# Patient Record
Sex: Female | Born: 1970 | Race: Black or African American | Hispanic: No | Marital: Single | Smoking: Never smoker
Health system: Southern US, Community
[De-identification: ages and names within clinical notes are randomized; demographics above are authoritative.]

## PROBLEM LIST (undated history)

## (undated) DIAGNOSIS — R42 Dizziness and giddiness: Secondary | ICD-10-CM

## (undated) HISTORY — PX: ABDOMINAL HYSTERECTOMY: SHX81

## (undated) HISTORY — PX: TOTAL KNEE ARTHROPLASTY: SHX125

---

## 2001-10-27 ENCOUNTER — Emergency Department (HOSPITAL_COMMUNITY): Admission: EM | Admit: 2001-10-27 | Discharge: 2001-10-27 | Payer: Self-pay | Admitting: Emergency Medicine

## 2001-10-27 ENCOUNTER — Encounter: Payer: Self-pay | Admitting: *Deleted

## 2001-12-30 ENCOUNTER — Encounter: Payer: Self-pay | Admitting: Emergency Medicine

## 2001-12-30 ENCOUNTER — Emergency Department (HOSPITAL_COMMUNITY): Admission: EM | Admit: 2001-12-30 | Discharge: 2001-12-30 | Payer: Self-pay | Admitting: Emergency Medicine

## 2002-01-02 ENCOUNTER — Emergency Department (HOSPITAL_COMMUNITY): Admission: EM | Admit: 2002-01-02 | Discharge: 2002-01-02 | Payer: Self-pay | Admitting: *Deleted

## 2002-05-10 ENCOUNTER — Emergency Department (HOSPITAL_COMMUNITY): Admission: EM | Admit: 2002-05-10 | Discharge: 2002-05-10 | Payer: Self-pay | Admitting: Internal Medicine

## 2002-07-17 ENCOUNTER — Encounter: Payer: Self-pay | Admitting: Emergency Medicine

## 2002-07-17 ENCOUNTER — Emergency Department (HOSPITAL_COMMUNITY): Admission: EM | Admit: 2002-07-17 | Discharge: 2002-07-17 | Payer: Self-pay | Admitting: Emergency Medicine

## 2002-08-04 ENCOUNTER — Inpatient Hospital Stay (HOSPITAL_COMMUNITY): Admission: RE | Admit: 2002-08-04 | Discharge: 2002-08-06 | Payer: Self-pay | Admitting: Obstetrics and Gynecology

## 2002-08-10 ENCOUNTER — Emergency Department (HOSPITAL_COMMUNITY): Admission: EM | Admit: 2002-08-10 | Discharge: 2002-08-10 | Payer: Self-pay | Admitting: Emergency Medicine

## 2003-09-29 ENCOUNTER — Emergency Department (HOSPITAL_COMMUNITY): Admission: EM | Admit: 2003-09-29 | Discharge: 2003-09-29 | Payer: Self-pay | Admitting: Emergency Medicine

## 2007-06-06 ENCOUNTER — Emergency Department (HOSPITAL_COMMUNITY): Admission: EM | Admit: 2007-06-06 | Discharge: 2007-06-06 | Payer: Self-pay | Admitting: Emergency Medicine

## 2007-08-04 ENCOUNTER — Emergency Department (HOSPITAL_COMMUNITY): Admission: EM | Admit: 2007-08-04 | Discharge: 2007-08-04 | Payer: Self-pay | Admitting: Emergency Medicine

## 2007-08-27 ENCOUNTER — Emergency Department (HOSPITAL_COMMUNITY): Admission: EM | Admit: 2007-08-27 | Discharge: 2007-08-27 | Payer: Self-pay | Admitting: Emergency Medicine

## 2008-11-19 ENCOUNTER — Emergency Department (HOSPITAL_COMMUNITY): Admission: EM | Admit: 2008-11-19 | Discharge: 2008-11-19 | Payer: Self-pay | Admitting: Emergency Medicine

## 2009-04-27 ENCOUNTER — Emergency Department (HOSPITAL_COMMUNITY): Admission: EM | Admit: 2009-04-27 | Discharge: 2009-04-27 | Payer: Self-pay | Admitting: Emergency Medicine

## 2009-04-27 ENCOUNTER — Encounter: Payer: Self-pay | Admitting: Orthopedic Surgery

## 2009-06-11 ENCOUNTER — Emergency Department (HOSPITAL_COMMUNITY): Admission: EM | Admit: 2009-06-11 | Discharge: 2009-06-11 | Payer: Self-pay | Admitting: Emergency Medicine

## 2009-09-05 ENCOUNTER — Encounter: Payer: Self-pay | Admitting: Orthopedic Surgery

## 2009-09-05 ENCOUNTER — Emergency Department (HOSPITAL_COMMUNITY): Admission: EM | Admit: 2009-09-05 | Discharge: 2009-09-05 | Payer: Self-pay | Admitting: Emergency Medicine

## 2009-09-16 ENCOUNTER — Ambulatory Visit: Payer: Self-pay | Admitting: Orthopedic Surgery

## 2009-09-16 DIAGNOSIS — IMO0002 Reserved for concepts with insufficient information to code with codable children: Secondary | ICD-10-CM

## 2009-11-04 ENCOUNTER — Encounter: Payer: Self-pay | Admitting: Orthopedic Surgery

## 2010-04-22 NOTE — Assessment & Plan Note (Signed)
Summary: [facility]  6/16 left knee sprain xr there/selfpay/bsf   Vital Signs:  Patient profile:   40 year old female Height:      64 inches Weight:      244 pounds Pulse rate:   64 / minute Resp:     16 per minute  Vitals Entered By: Fuller Canada MD (September 16, 2009 9:20 AM)  Visit Type:  new patient Referring Provider:  [facility] Primary Provider:  Dr. Sheliah Mends  CC:  left knee pain.  History of Present Illness: I saw [name] in the office today for an initial visit.  She is a 40 years old woman with the complaint of:  left knee pain.  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  This was done at [facility].  She says there was talk about amputating her leg.  She has had no major difficulty with the LEFT knee prosthesis since that time until she fell about 11 days ago on June 16.  She complains of medial knee pain.  Xrays [facility] 09/05/09 left knee.  other imaging studies include the following:  L spine xrays February 2011   Meds: Hydrocodone 7.5 for pain as needed, from er helps, needs refill.  She is currently wearing a straight leg knee brace which seems to control the stability of her leg and improve the pain along with a medicine  Allergies (verified): No Known Drug Allergies  Past History:  Past Medical History: na  Past Surgical History: left salk left total knee hysterectomy  Family History: FH of Cancer:  Family History of Diabetes Family History Coronary Heart Disease female < 62 Family History of Arthritis Hx, family, kidney disease NEC  Social History: Patient is single.  unemployed no smoking occasional alcohol no caffeine 12th grade education  Review of Systems Musculoskeletal:  Complains of joint pain, swelling, instability, and stiffness; denies redness, heat,  and muscle pain.  The review of systems is negative for Constitutional, Cardiovascular, Respiratory, Gastrointestinal, Genitourinary, Neurologic, Endocrine, Psychiatric, Skin, HEENT, Immunology, and Hemoatologic.  Physical Exam  Skin:  large curvilinear incision over the LEFT knee which is healed it goes up into the thigh area as well Psych:  alert and cooperative; normal mood and affect; normal attention span and concentration   Knee Exam  General:    slightly obese female vital signs as recorded  Gait:    ambulates with a slight limp favoring the LEFT side with a brace  Inspection:    no swelling in the LEFT knee  Palpation:    tenderness on the medial knee along the medial hamstrings and the medial joint line  Vascular:    There was no swelling or varicose veins. The pulses and temperature are normal. There was no edema or tenderness.  Sensory:    Gross coordination and sensation were normal.    Motor:    motor strength remained normal in extension as well as flexion  Knee Exam:    Left:    Inspection/Palpation:  LEFT knee has mild valgus laxity in extension as well as flexion.  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 1 by mouth q 4 as needed pain  Patient Instructions: 1)  Wear the  brace for  4 weeks  2)  Use a cane in your right hand  3)  Take the brace off for exercises for knee  4)  bending 25 times 5)  f/u appt in 4 weeks  Prescriptions: NORCO 7.5-325 MG TABS (HYDROCODONE-ACETAMINOPHEN) 1 by mouth q 4 as needed pain  #84 x 1   Entered and Authorized by:   Fuller Canada MD   Signed by:   Fuller Canada MD on 09/16/2009   Method used:   Print then Give to Patient   RxID:   [id number]

## 2010-04-22 NOTE — Letter (Signed)
Summary: missed appointment  missed appointment   Imported By: Cammie Sickle 11/14/2009 08:37:22  _____________________________________________________________________  External Attachment:    Type:   Image     Comment:   External Document

## 2010-04-22 NOTE — Medication Information (Signed)
Summary: RX Folder knee brace  RX Folder knee brace   Imported By: Cammie Sickle 09/17/2009 11:49:58  _____________________________________________________________________  External Attachment:    Type:   Image     Comment:   External Document

## 2010-04-25 NOTE — Letter (Signed)
Summary: History form  History form   Imported By: Jacklynn Ganong 09/17/2009 16:34:50  _____________________________________________________________________  External Attachment:    Type:   Image     Comment:   External Document

## 2010-06-28 LAB — BASIC METABOLIC PANEL
BUN: 14 mg/dL (ref 6–23)
Calcium: 9.3 mg/dL (ref 8.4–10.5)
Chloride: 107 mEq/L (ref 96–112)
Creatinine, Ser: 0.88 mg/dL (ref 0.4–1.2)
GFR calc non Af Amer: >60 mL/min (ref >60)
Glucose, Bld: 100 mg/dL — ABNORMAL HIGH (ref 70–99)

## 2010-06-28 LAB — DIFFERENTIAL
Basophils Absolute: 0 10*3/uL (ref 0.0–0.1)
Basophils Relative: 0 % (ref 0–1)
Eosinophils Relative: 1 % (ref 0–5)
Lymphocytes Relative: 26 % (ref 12–46)
Lymphs Abs: 1.9 10*3/uL (ref 0.7–4.0)
Neutro Abs: 5.1 10*3/uL (ref 1.7–7.7)
Neutrophils Relative %: 69 % (ref 43–77)

## 2010-06-28 LAB — CBC
HCT: 39.5 % (ref 36.0–46.0)
MCHC: 35.6 g/dL (ref 30.0–36.0)
RBC: 4.48 MIL/uL (ref 3.87–5.11)
WBC: 7.4 10*3/uL (ref 4.0–10.5)

## 2010-08-08 NOTE — H&P (Signed)
NAMEAUJANAE, MCCULLUM                           ACCOUNT NO.:  [id number]   MEDICAL RECORD NO.:  [id number]                   PATIENT TYPE:  AMB   LOCATION:  DAY                                  FACILITY:  [facility]   PHYSICIAN:  Tilda Burrow, M.D.              DATE OF BIRTH:  1970/12/27   DATE OF ADMISSION:  08/04/2002  DATE OF DISCHARGE:                                HISTORY & PHYSICAL   ADMISSION DIAGNOSES:  1. Dermoid cyst of right ovary.  2. Menorrhagia.   HISTORY OF PRESENT ILLNESS:  This 40 year old female, gravida 2, para 2,  status post tubal ligation, comes in with a prior history of left  oophorectomy for a cyst many years ago.  She is admitted at this time for  removal of a right ovarian dermoid and possible removal of the entire right  tube and ovary and the uterus due to a symptomatic right ovarian dermoid.  The patient has been followed through our office intermittently over the  past few years.  In 2001, the dermoid was noted on ultrasound.  The dermoid  was originally noted as far back as 1997.  Unfortunately over the past few  months, the size has increased to the point that she is progressively  symptomatic.  She is not relieved with Darvocet on a regular daily basis.  Exam showed tenderness in the area of the ovary.  It is felt that the mass  effect is causing discomfort.  We do not think that there is torsion at this  time.  The patient is aware that with the dermoid cyst measuring 6 x 3.6 x  3.1 that it may or may not be possible to salvage the ovary.  If salpingo-  oophorectomy is necessary, plans are to proceed with hysterectomy at the  same time.  The patient has a history of heavy periods, which in 2001  required the initiation of Depo-Provera therapy.  Depo-Provera has been  effective, but according to the patient it is preferable to proceed with  hysterectomy if the ovary cannot be preserved.  This management plan was  discussed at length with her at her  last office visit on Jul 24, 2002, with  the patient expressing understanding and full support.  The patient is  clearly aware that if a hysterectomy is performed that she will be  permanently incapable of bearing children as required by Medicaid.   PAST MEDICAL HISTORY:  Benign.   PAST SURGICAL HISTORY:  1. In 1987, cesarean section.  2. In 1994, left salpingo-oophorectomy  3. 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   PHYSICAL EXAMINATION:  GENERAL APPEARANCE:  A healthy, large-framed, African  American female.  WEIGHT:  230-1/2 pounds.  VITAL SIGNS:  Blood pressure 124/66.  HEENT:  Pupils equal, round, and reactive.  Extraocular movements intact.  NECK:  Supple.  Trachea midline.  CHEST:  Clear to auscultation.  ABDOMEN:  Well-healed abdominal scar, midline.  PELVIC:  External genitalia normal.  Vaginal exam shows normal secretions.  Reports normal Pap smear with no history of prior abnormalities.  Cervix  smooth and normal to appearance.  Uterus anterior and normal in size, shape,  and contour.  Adnexa with previously mentioned cyst on the right side and  diffuse tenderness on diffuse exam due to the patient's size.  EXTREMITIES:  Evidence of left knee replacement with no cyanosis, clubbing,  or edema.   PLAN:  Laparotomy with removal of dermoid cyst if possible or if it is  technically challenged, then may proceed directly to salpingo-oophorectomy  with hysterectomy at the same time.                                               Tilda Burrow, M.D.    JVF/MEDQ  D:  08/03/2002  T:  08/03/2002  Job:  [id number]

## 2010-08-08 NOTE — Consult Note (Signed)
   NAME:  [name], GRAJALES NO.:  [id number]   MEDICAL RECORD NO.:  [id number]                   PATIENT TYPE:  EMS   LOCATION:  ED                                   FACILITY:  [facility]   PHYSICIAN:  Tilda Burrow, M.D.              DATE OF BIRTH:  Jan 15, 1971   DATE OF CONSULTATION:  DATE OF DISCHARGE:  08/10/2002                                   CONSULTATION   HISTORY:  Allyiah presents to the emergency room at approximately 5 p.m. on  Aug 10, 2002, complaining of wound opening.  She was seen earlier today in  our office as per the patient reported having her staples taken out ahead of  schedule.  She is status post recent abdominal surgery with midline  incision.  The patient left the office without any serious new complaints,  with the incision appearing intact.  When she went home and used the  bathroom, leaning forward caused a disruption of the circumcision of the  skin and she presented to the emergency room for evaluation.  The patient  had placed a clean, dry, pad across the wound. Upon inspection it was  approximately 2.5 to 3.0 cm in depth by 10 cm in length in the midline with  clear, nonpurulent tissue seen.   PROCEDURE:  The wound was cleaned with saline solution, and sterile  technique, and then pulled together with Benzoin and deep tissue was  reapproximated quite nicely.   Due to the excellent abdominal appearance, she was sent home rather than  admitted.  She had prophylactic antibiotics with Keflex 500 t.i.d. x5 days.  She will need to follow up in our office first thing next Monday.                                               Tilda Burrow, M.D.    JVF/MEDQ  D:  08/28/2002  T:  08/28/2002  Job:  [id number]

## 2010-08-08 NOTE — Op Note (Signed)
NAME:  [name], [name] NO.:  [id number]   MEDICAL RECORD NO.:  [id number]                   PATIENT TYPE:  INP   LOCATION:  A416                                 FACILITY:  [facility]   PHYSICIAN:  Tilda Burrow, M.D.              DATE OF BIRTH:  [birth year]   DATE OF PROCEDURE:  08/04/2002  DATE OF DISCHARGE:                                 OPERATIVE REPORT   PREOPERATIVE DIAGNOSES:  Dermoid cyst right ovary, menorrhagia.   POSTOPERATIVE DIAGNOSES:  Dermoid cyst right ovary, menorrhagia.  Pelvic  adhesions.   PROCEDURE:  Total abdominal hysterectomy, right salpingo-oophorectomy.   SURGEON:  Tilda Burrow, M.D.   ASSISTANT:  Mcfadder, Tullock.   ANESTHESIA:  General.   COMPLICATIONS:  None.   FINDINGS:  Omental adhesions to the anterior abdominal wall, omental  adhesions to the right ovary, adhesions from sigmoid to back of uterus.   DESCRIPTION OF PROCEDURE:  The patient was taken to the operating room,  prepped and draped in the usual fashion for lower abdominal surgery. The  prior vertical incision was repeated with careful entry due to extensive  adhesions of omentum to the anterior abdominal wall which required 10-15  minutes a day section. We then were able to pack the bowel away and inspect  the pelvis. There were omental adhesions to the top of the ovary on the  right and there had been some torsion of the right tube and ovary though it  was not necrotic or ischemic. The omental adhesions were cut free and a raw  surface noted on the tip of the ovary. The ovary was not disrupted. The  pelvis was inspected and due to the extensive nature of the adhesions, it  was felt that efforts at preserving the uterus and portions of the ovary  would be counter productive. We then proceed with a hysterectomy and removal  of the ovary.   The round ligaments were taken down bilaterally, with clamping and cutting  and suture ligating. The right  infundibulopelvic ligament was isolated,  clamped, cut and suture ligated. The uterine vessels were skeletonized on  either side. The left remnants of the IP ligament were clamped, cut and  suture ligated close to the uterus. The bladder flap was developed  anteriorly with some difficulty due to scarring from prior cesarean  sections.   The uterine vessels were clamped, cut and suture ligated, the upper and  lower cardinal ligaments serially taken down with straight Heaney clamps,  knife dissection and #0 chromic suture ligature. Upon reaching the level of  the vaginal cuff, we entered through an anterior stab incision and  circumcised the cervix off the vaginal cuff. Two Aldridge stitches were  placed through each lateral vaginal angle for hemostasis. The irregular edge  on the anterior portion of the cervical cuff was trimmed for reapproximation  and an interrupted suture placed anteriorly to  pull the edge together. The  cuff then was closed transversely with good hemostasis appreciated though  there had been generous blood loss from the cuff edge prior to that. The  pelvis was irrigated and inspected and confirmed as hemostatic. Laparotomy  equipment removed. Omentum was inspected and three spots required hemostatic  clamps across oozing areas and then 3-0 Dexon ligature of the pedicles  around the omental adhesions. We then closed the peritoneal cavity by 2-0  Chromic closure. We then closed the fascia with running #0 Prolene, then the  subcu tissues were reapproximated using 2-0 plain and then staple closure of  the skin completed this procedure. The patient went to the recovery room in  good condition and had a small amount of vaginal blood noted in recovery  which will be observed and it was felt this represented accumulation prior  to cuff closure rather than ongoing bleeding, but will be monitored.                                               Tilda Burrow, M.D.     JVF/MEDQ  D:  08/04/2002  T:  08/05/2002  Job:  [id number]

## 2010-08-08 NOTE — Discharge Summary (Signed)
   [name], [name]                           ACCOUNT NO.:  [id number]   MEDICAL RECORD NO.:  [id number]                   PATIENT TYPE:  INP   LOCATION:  A416                                 FACILITY:  [facility]   PHYSICIAN:  Tilda Burrow, M.D.              DATE OF BIRTH:  1971-01-22   DATE OF ADMISSION:  08/04/2002  DATE OF DISCHARGE:  08/06/2002                                 DISCHARGE SUMMARY   ADMISSION DIAGNOSES:  1. Dermoid cyst, right ovary.  2. Menometrorrhagia.   DISCHARGE DIAGNOSES:  Right ovarian dermoid cyst and adhesions.   PROCEDURE:  On 08/04/02, total abdominal hysterectomy and right salpingo-  oophorectomy.   DISCHARGE MEDICATIONS:  1. Estraderm 0.1 mg patch to skin every week.  2. Tylox one to two q.4h. p.r.n. pain.   FOLLOW UP:  Five days for staple removal at our office.   HISTORY OF PRESENT ILLNESS:  This 40 year old female, gravida 2, para 2  status post tubal ligation with a history of left oophorectomy for a cyst  many years ago is admitted for removal of right ovarian dermoid, possible  removal of right tube and ovary and uterus.   HOSPITAL COURSE:  The patient was taken to the operating room on 08/04/02,  underwent a hysterectomy and right salpingo-oophorectomy which was notable  for significant amount of adhesions at the time of surgery. Estimated blood  loss was 300 cc. Postoperative care was straightforward with 300 cc of blood  loss estimated at the time of surgery. Pathology report showed nabothian  cysts, chronic cervicitis of the cervix. The right ovarian dermoid cyst  showed benign tissues of cartilage respiratory including sebaceous and hair  structures as well as thyroid tissue. There was no evidence of neural tissue  and specifically no immature neural tissue was noted.   The patient's postoperative hemoglobin was 13.9; followup was 12.6 and 34.9  compared to 13.9 and 39.0 preoperatively. The patient had an uneventful  postoperative  course and was continued stable for discharge in two days for  followup, one week after the initial surgery for staple removal.                                               Tilda Burrow, M.D.    JVF/MEDQ  D:  09/03/2002  T:  09/03/2002  Job:  [id number]

## 2010-08-08 NOTE — Consult Note (Signed)
   NAME:  [name], [name] NO.:  [id number]   MEDICAL RECORD NO.:  [id number]                   PATIENT TYPE:  EMS   LOCATION:  ED                                   FACILITY:  [facility]   PHYSICIAN:  J. Darreld Mclean, M.D.              DATE OF BIRTH:  12-16-1970   DATE OF CONSULTATION:  DATE OF DISCHARGE:  01/02/2002                                   CONSULTATION   HISTORY:  She sustained a fracture, small avulsion type, of the distal beak  of the calcaneous from a fall sustained the other week.  She was on an air  cast.  She has crutches.  This is a very small avulsion-type fracture.  No  other injuries.  Neurovascularly intact.   IMPRESSION:  Small avulsion-type fracture distal beak of the calcaneous.   PLAN:  This should heal on its own.  She already has something for pain.  She should just keep her weight off it with crutches.  I will see her in the  office in three weeks.  Any difficulty before office visit, let me know.  We  need to get x-rays at that time.                                                           Teola Bradley, M.D.    JWK/MEDQ  D:  01/02/2002  T:  01/03/2002  Job:  [id number]

## 2012-05-27 ENCOUNTER — Emergency Department (HOSPITAL_COMMUNITY)
Admission: EM | Admit: 2012-05-27 | Discharge: 2012-05-27 | Disposition: A | Discharge status: Other Acute Inpatient Hospital | Payer: Self-pay | Attending: Emergency Medicine | Admitting: Emergency Medicine

## 2012-05-27 ENCOUNTER — Emergency Department (HOSPITAL_COMMUNITY): Payer: Self-pay

## 2012-05-27 ENCOUNTER — Encounter (HOSPITAL_COMMUNITY): Payer: Self-pay | Admitting: *Deleted

## 2012-05-27 DIAGNOSIS — S82209A Unspecified fracture of shaft of unspecified tibia, initial encounter for closed fracture: Secondary | ICD-10-CM | POA: Insufficient documentation

## 2012-05-27 DIAGNOSIS — X500XXA Overexertion from strenuous movement or load, initial encounter: Secondary | ICD-10-CM | POA: Insufficient documentation

## 2012-05-27 DIAGNOSIS — S82202A Unspecified fracture of shaft of left tibia, initial encounter for closed fracture: Secondary | ICD-10-CM

## 2012-05-27 DIAGNOSIS — R296 Repeated falls: Secondary | ICD-10-CM | POA: Insufficient documentation

## 2012-05-27 DIAGNOSIS — Y9210 Unspecified residential institution as the place of occurrence of the external cause: Secondary | ICD-10-CM | POA: Insufficient documentation

## 2012-05-27 DIAGNOSIS — Y990 Civilian activity done for income or pay: Secondary | ICD-10-CM | POA: Insufficient documentation

## 2012-05-27 DIAGNOSIS — Z96659 Presence of unspecified artificial knee joint: Secondary | ICD-10-CM | POA: Insufficient documentation

## 2012-05-27 MED ORDER — HYDROMORPHONE HCL PF 1 MG/ML IJ SOLN
1.0000 mg | Freq: Once | INTRAMUSCULAR | Status: AC
  Administered 2012-05-27: 1 mg via INTRAVENOUS
  Filled 2012-05-27: qty 1

## 2012-05-27 MED ORDER — OXYCODONE-ACETAMINOPHEN 5-325 MG PO TABS
2.0000 | ORAL_TABLET | Freq: Once | ORAL | Status: AC
  Administered 2012-05-27: 2 via ORAL
  Filled 2012-05-27: qty 2

## 2012-05-27 MED ORDER — ONDANSETRON HCL 4 MG/2ML IJ SOLN
4.0000 mg | Freq: Once | INTRAMUSCULAR | Status: AC
  Administered 2012-05-27: 4 mg via INTRAVENOUS
  Filled 2012-05-27: qty 2

## 2012-05-27 MED ORDER — HYDROMORPHONE HCL PF 1 MG/ML IJ SOLN
1.0000 mg | INTRAMUSCULAR | Status: AC
  Administered 2012-05-27: 1 mg via INTRAVENOUS
  Filled 2012-05-27: qty 1

## 2012-05-27 NOTE — ED Notes (Signed)
Patient transported to X-ray 

## 2012-05-27 NOTE — ED Provider Notes (Signed)
History     CSN: [id number]  Arrival date & time 05/27/12  1737   First MD Initiated Contact with Patient 05/27/12 1741      Chief Complaint  Patient presents with  . Fall    (Consider location/radiation/quality/duration/timing/severity/associated sxs/prior treatment) HPI Comments: Patient was working at [employer] [facility] facility when she twistedawkwardly and fell with her left leg behind her period has pain in her mid tibia. Denies hitting her head or losing consciousness. She has a history of a knee replacement in the same leg. She denies any head, neck, back, abdominal pain. She is unable to bear weight on the left leg. She denies any dizziness, lightheadedness, chest pain or shortness of breath.  The history is provided by the patient and the EMS personnel.    History reviewed. No pertinent past medical history.  History reviewed. No pertinent past surgical history.  No family history on file.  History  Substance Use Topics  . Smoking status: Not on file  . Smokeless tobacco: Not on file  . Alcohol Use: Not on file    OB History   Grav Para Term Preterm Abortions TAB SAB Ect Mult Living                  Review of Systems  Constitutional: Negative for fever, activity change and appetite change.  HENT: Negative for congestion and rhinorrhea.   Respiratory: Negative for cough, chest tightness and shortness of breath.   Gastrointestinal: Negative for nausea, vomiting and abdominal pain.  Genitourinary: Negative for vaginal bleeding and vaginal discharge.  Musculoskeletal: Positive for myalgias and arthralgias. Negative for back pain.  Skin: Negative for rash.  Neurological: Negative for dizziness, weakness and headaches.  A complete 10 system review of systems was obtained and all systems are negative except as noted in the HPI and PMH.    Allergies  Review of patient's allergies indicates no known allergies.  Home Medications  No current outpatient  prescriptions on file.  BP 129/76  Pulse 82  Temp(Src) 97.4 F (36.3 C) (Oral)  SpO2 100%  Physical Exam  Constitutional: She is oriented to person, place, and time. She appears well-developed and well-nourished. No distress.  HENT:  Head: Normocephalic and atraumatic.  Mouth/Throat: Oropharynx is clear and moist. No oropharyngeal exudate.  Eyes: Conjunctivae and EOM are normal. Pupils are equal, round, and reactive to light.  Neck: Normal range of motion. Neck supple.  No C spine pain  Cardiovascular: Normal rate, regular rhythm and normal heart sounds.   No murmur heard. Pulmonary/Chest: Effort normal and breath sounds normal. No respiratory distress.  Abdominal: Soft. There is no tenderness. There is no rebound and no guarding.  Musculoskeletal: Normal range of motion. She exhibits no edema and no tenderness.  TTP mid tib on R with swelling and hematoma.  +2 DP and PT pulses  Neurological: She is alert and oriented to person, place, and time. No cranial nerve deficit. She exhibits normal muscle tone. Coordination normal.  Skin: Skin is warm.    ED Course  Procedures (including critical care time)  Labs Reviewed - No data to display Dg Knee 1-2 Views Left  05/27/2012  *RADIOLOGY REPORT*  Clinical Data: Fall, left knee pain/swelling  LEFT KNEE - 1-2 VIEW  Comparison: 09/05/2009  Findings: Left knee arthroplasty.  A metallic disc/rod component of the prosthesis is now displaced along the left lateral aspect of the joint space.  Minimally displaced fracture of the proximal/mid tibial shaft, along the  distal aspect of the prosthesis.  Nondisplaced proximal fibular fracture.  Associated soft tissue swelling.  IMPRESSION: Minimally displaced fracture of the proximal/mid tibial shaft, along the distal aspect of the left knee prosthesis.  A metallic disc/rod component of the prosthesis is now displaced along the left lateral aspect of the joint space.  Nondisplaced proximal fibular fracture.    Original Report Authenticated By: Charline Bills, M.D.    Dg Tibia/fibula Left  05/27/2012  *RADIOLOGY REPORT*  Clinical Data: Fall, left lower leg pain/swelling  LEFT TIBIA AND FIBULA - 2 VIEW  Comparison: 09/05/2009  Findings: Left knee arthroplasty.  Minimally displaced fracture of the proximal/mid tibial shaft, along the distal aspect of the prosthesis.  Nondisplaced proximal fibular fracture.  Associated soft tissue swelling.  IMPRESSION: Minimally displaced fracture of the proximal/mid tibial shaft, along the distal aspect of the left knee prosthesis.  Nondisplaced proximal fibular fracture.   Original Report Authenticated By: Charline Bills, M.D.      No diagnosis found.    MDM  Mechanical fall with left lower leg pain. Did not hit head or lose consciousness.. Previous left knee replacement for tumor that was benign.  Patient has tibia and fibula fractures around the left knee prosthesis. Reviewed images with Dr. Ave Filter orthopedics. Her knee replacement is a special type done for tumors. Dr. Ave Filter would like her to go to [facility] to have this problem evaluated. Patient states her prosthesis was done in the early 1990s by Dr. Justice Britain.  Patient discussed with Dr. love of [facility]. He accepts patient in transfer to [facility] ER.   Glynn Octave, MD 05/27/12 2158

## 2012-05-27 NOTE — ED Notes (Signed)
Pt is a Scientist, clinical (histocompatibility and immunogenetics) at [employer]. Slipped on wet floor, lost her balance, twister her left ankle and landed on her coccyx. Pt denies hitting head or LOC. Pt states she hurt a "pop" and is unable to move the ankle. Pt denies nausea and vomiting. Pt has a splint to it applied by EMS. Has an 18g piv in left a/c and of fentanyl given by EMS. Pt rates pain 5/10 at this time. +2 pedal pulse per EMS palpated.

## 2012-06-09 ENCOUNTER — Encounter (HOSPITAL_COMMUNITY): Payer: Self-pay | Admitting: *Deleted

## 2012-06-09 ENCOUNTER — Emergency Department (HOSPITAL_COMMUNITY)
Admission: EM | Admit: 2012-06-09 | Discharge: 2012-06-09 | Disposition: A | Discharge status: Home / Self Care | Payer: Worker's Compensation | Attending: Emergency Medicine | Admitting: Emergency Medicine

## 2012-06-09 DIAGNOSIS — R42 Dizziness and giddiness: Secondary | ICD-10-CM | POA: Insufficient documentation

## 2012-06-09 DIAGNOSIS — Z4801 Encounter for change or removal of surgical wound dressing: Secondary | ICD-10-CM | POA: Insufficient documentation

## 2012-06-09 DIAGNOSIS — Z96659 Presence of unspecified artificial knee joint: Secondary | ICD-10-CM | POA: Insufficient documentation

## 2012-06-09 DIAGNOSIS — Z79899 Other long term (current) drug therapy: Secondary | ICD-10-CM | POA: Insufficient documentation

## 2012-06-09 DIAGNOSIS — Z8781 Personal history of (healed) traumatic fracture: Secondary | ICD-10-CM | POA: Insufficient documentation

## 2012-06-09 LAB — CBC WITH DIFFERENTIAL/PLATELET
Eosinophils Relative: 1 % (ref 0–5)
HCT: 32.6 % — ABNORMAL LOW (ref 36.0–46.0)
Lymphocytes Relative: 19 % (ref 12–46)
Lymphs Abs: 1.7 10*3/uL (ref 0.7–4.0)
MCV: 87.9 fL (ref 78.0–100.0)
Monocytes Absolute: 0.5 10*3/uL (ref 0.1–1.0)
Monocytes Relative: 6 % (ref 3–12)
Neutrophils Relative %: 75 % (ref 43–77)
WBC: 9.3 10*3/uL (ref 4.0–10.5)

## 2012-06-09 NOTE — ED Notes (Signed)
Dermabond placed at bedside; EDP (Dr. Rubin Payor) at bedside with Dermabond at this time; will continue to monitor.

## 2012-06-09 NOTE — ED Notes (Signed)
Pt with 2nd total knee replacement to L knee on 3/10.  Wound drain was removed from her L thigh 3/13.  Since then blood has been "oozing" from site.  However, last night she called EMS b/c site would not stop bleeding.  EMS placed pressure dressing.  This am, pt cleaned wound and bleeding started again.  Dressing on wound at this time and no blood noted through dressing.  Pt with sutures from mid-calf to above knee.

## 2012-06-09 NOTE — ED Provider Notes (Signed)
History     CSN: [id number]  Arrival date & time 06/09/12  1419   First MD Initiated Contact with Patient 06/09/12 1810      Chief Complaint  Patient presents with  . Wound Check    bleeding from surgical site    (Consider location/radiation/quality/duration/timing/severity/associated sxs/prior treatment) Patient is a 42 y.o. female presenting with wound check. The history is provided by the patient.  Wound Check   patient had a recent redo knee replacement after a fracture. He was done at [facility]. She has had a proximal drain removed several days ago. She states she's been bleeding since. She states she's felt a little lightheaded. She had a little oozing at the lower end of the wound, but no other bleeding. She is on low-dose Lovenox. She states it is a dark blood coming out. History reviewed. No pertinent past medical history.  Past Surgical History  Procedure Laterality Date  . Total knee arthroplasty      x 2  . Abdominal hysterectomy      No family history on file.  History  Substance Use Topics  . Smoking status: Not on file  . Smokeless tobacco: Not on file  . Alcohol Use: Not on file    OB History   Grav Para Term Preterm Abortions TAB SAB Ect Mult Living                  Review of Systems  Constitutional: Negative for fatigue.  Skin: Positive for wound.  Neurological: Positive for light-headedness.  Hematological: Negative for adenopathy. Does not bruise/bleed easily.    Allergies  Review of patient's allergies indicates no known allergies.  Home Medications   Current Outpatient Rx  Name  Route  Sig  Dispense  Refill  . acetaminophen (TYLENOL) 325 MG tablet   Oral   Take 650 mg by mouth every 6 (six) hours.         . celecoxib (CELEBREX) 200 MG capsule   Oral   Take 200 mg by mouth every 12 (twelve) hours.         . enoxaparin (LOVENOX) 40 MG/0.4ML injection   Subcutaneous   Inject 40 mg into the skin daily.         Marland Kitchen gabapentin  (NEURONTIN) 100 MG capsule   Oral   Take 100 mg by mouth every 8 (eight) hours.         Marland Kitchen ibuprofen (ADVIL,MOTRIN) 200 MG tablet   Oral   Take 400 mg by mouth every 6 (six) hours as needed for pain.         Marland Kitchen oxycodone (OXY-IR) 5 MG capsule   Oral   Take 5-15 mg by mouth every 3 (three) hours as needed for pain.         Marland Kitchen senna (SENOKOT) 8.6 MG tablet   Oral   Take 2 tablets by mouth at bedtime as needed for constipation.           BP 118/74  Pulse 69  Temp(Src) 98.4 F (36.9 C) (Oral)  Resp 18  SpO2 100%  Physical Exam  Nursing note and vitals reviewed. Constitutional: She is oriented to person, place, and time. She appears well-developed and well-nourished.  HENT:  Head: Normocephalic and atraumatic.  Eyes: EOM are normal. Pupils are equal, round, and reactive to light.  Neck: Normal range of motion. Neck supple.  Cardiovascular: Normal rate, regular rhythm and normal heart sounds.   No murmur heard. Pulmonary/Chest: Effort normal and  breath sounds normal. No respiratory distress. She has no wheezes. She has no rales.  Abdominal: Soft. Bowel sounds are normal. She exhibits no distension. There is no tenderness. There is no rebound and no guarding.  Musculoskeletal: Normal range of motion. She exhibits tenderness.  Large scar down left anterior lower extremity. It is sutured with minimal oozing inferiorly. There is a proximal wound from a previous JP drain. Has some oozing of dark liquid. It is rather watery. Mild diffuse tenderness along wound.  Neurological: She is alert and oriented to person, place, and time. No cranial nerve deficit.  Skin: Skin is warm and dry.  Psychiatric: She has a normal mood and affect. Her speech is normal.    ED Course  Procedures (including critical care time)  Labs Reviewed  CBC WITH DIFFERENTIAL - Abnormal; Notable for the following:    RBC 3.71 (*)    Hemoglobin 11.0 (*)    HCT 32.6 (*)    Platelets 588 (*)    All other  components within normal limits   No results found.   No diagnosis found.    MDM  Patient with oozing from the site of previous JP drain. His continued he was here and patient is on low-dose Lovenox. Hemoglobin is 11 unsure of baseline. She is not orthostatic. A single stitch was placed in the wound, but he continued to bleed. Dermabond was then placed over the stitch and wound. Bleeding appears controlled. A dressing was placed over it. She'll followup with her orthopedic surgeon in 7 days as planned        [facility] R. Rubin Payor, MD 06/09/12 [phone]

## 2012-06-09 NOTE — ED Notes (Signed)
Patient report obtained from [facility], care taken over at this time. Patient is resting on stretcher at this time. Site is still oozing after suture was placed, MD notified. Will continue to monitor patient. Patient rates pain at a 4/10 at this time. MD ordered dermabond at this time for site.

## 2012-06-09 NOTE — ED Notes (Signed)
Patient given copy of discharge paperwork; went over discharge instructions with patient.  Patient instructed to keep follow up appointments and to follow up with referral.  Instructed to return to the ED for new, worsening, or concerning symptoms.

## 2012-06-23 ENCOUNTER — Emergency Department (HOSPITAL_COMMUNITY): Payer: Self-pay

## 2012-06-23 ENCOUNTER — Emergency Department (HOSPITAL_COMMUNITY)
Admission: EM | Admit: 2012-06-23 | Discharge: 2012-06-23 | Disposition: A | Discharge status: Home / Self Care | Payer: Self-pay | Attending: Emergency Medicine | Admitting: Emergency Medicine

## 2012-06-23 ENCOUNTER — Encounter (HOSPITAL_COMMUNITY): Payer: Self-pay | Admitting: *Deleted

## 2012-06-23 DIAGNOSIS — T819XXA Unspecified complication of procedure, initial encounter: Secondary | ICD-10-CM

## 2012-06-23 DIAGNOSIS — Z96659 Presence of unspecified artificial knee joint: Secondary | ICD-10-CM | POA: Insufficient documentation

## 2012-06-23 DIAGNOSIS — Y838 Other surgical procedures as the cause of abnormal reaction of the patient, or of later complication, without mention of misadventure at the time of the procedure: Secondary | ICD-10-CM | POA: Insufficient documentation

## 2012-06-23 DIAGNOSIS — IMO0002 Reserved for concepts with insufficient information to code with codable children: Secondary | ICD-10-CM | POA: Insufficient documentation

## 2012-06-23 DIAGNOSIS — Z79899 Other long term (current) drug therapy: Secondary | ICD-10-CM | POA: Insufficient documentation

## 2012-06-23 LAB — BASIC METABOLIC PANEL
BUN: 14 mg/dL (ref 6–23)
CO2: 26 mEq/L (ref 19–32)
Calcium: 9.5 mg/dL (ref 8.4–10.5)
Chloride: 102 mEq/L (ref 96–112)
Creatinine, Ser: 0.68 mg/dL (ref 0.50–1.10)
Glucose, Bld: 99 mg/dL (ref 70–99)

## 2012-06-23 LAB — CBC WITH DIFFERENTIAL/PLATELET
Eosinophils Relative: 1 % (ref 0–5)
HCT: 34.6 % — ABNORMAL LOW (ref 36.0–46.0)
Hemoglobin: 11.9 g/dL — ABNORMAL LOW (ref 12.0–15.0)
Lymphocytes Relative: 35 % (ref 12–46)
Lymphs Abs: 2 10*3/uL (ref 0.7–4.0)
MCV: 86.5 fL (ref 78.0–100.0)
Monocytes Absolute: 0.5 10*3/uL (ref 0.1–1.0)
Monocytes Relative: 8 % (ref 3–12)
RBC: 4 MIL/uL (ref 3.87–5.11)
RDW: 13.2 % (ref 11.5–15.5)
WBC: 5.7 10*3/uL (ref 4.0–10.5)

## 2012-06-23 MED ORDER — CEPHALEXIN 500 MG PO CAPS: 500.0000 mg | ORAL_CAPSULE | Freq: Four times a day (QID) | ORAL | Status: DC

## 2012-06-23 MED ORDER — OXYCODONE-ACETAMINOPHEN 5-325 MG PO TABS
2.0000 | ORAL_TABLET | Freq: Once | ORAL | Status: AC
  Administered 2012-06-23: 2 via ORAL
  Filled 2012-06-23: qty 2

## 2012-06-23 NOTE — ED Notes (Signed)
Had knee surgery March 10th and had to go back in to open March 28th. Knee began drain draining a brown color just PTA. Pt denies pain

## 2012-06-23 NOTE — ED Notes (Signed)
Call [facility] will beep Dr. Georgeanna Lea or whos taken call

## 2012-06-23 NOTE — ED Provider Notes (Signed)
History     This chart was scribed for Jacqueline Roller, MD, MD by [name], ED Scribe. The patient was seen in room APA19/APA19 and the patient's care was started at 1:18 PM.   CSN: [id number]  Arrival date & time 06/23/12  1227      Chief Complaint  Patient presents with  . Post-op Problem    The history is provided by the patient and medical records. No language interpreter was used.   [name] is a 42 y.o. female who presents to the Emergency Department complaining of post operative left knee drainage onset today. Pt reports that she has drainage that was squirting out from left knee at site of surgical incision. She reports that she cleaned the area and changed he bandage 1 day ago. She reports having mild pain rated at 5/10 that has been baseline since the surgery. She reports that she had total knee replacement 20 years ago. She states that she fell at work on 05/27/2012 and had surgery on 05/30/2012 (knee replacement) then on 06/17/2012 she has another surgery to make sure there was no infection. She was discharged from hospital 06/19/2012. Pt denies fever, chills, nausea, vomiting, diarrhea, weakness, cough, SOB and any other pain.  [phone] surgeon is [name]   History reviewed. No pertinent past medical history.  Past Surgical History  Procedure Laterality Date  . Total knee arthroplasty      x 2  . Abdominal hysterectomy      No family history on file.  History  Substance Use Topics  . Smoking status: Never Smoker   . Smokeless tobacco: Not on file  . Alcohol Use: Yes     Comment: Occ    OB History   Grav Para Term Preterm Abortions TAB SAB Ect Mult Living                  Review of Systems  Constitutional: Negative for fever and chills.  Gastrointestinal: Negative for nausea, vomiting and diarrhea.  Musculoskeletal: Positive for arthralgias.  All other systems reviewed and are negative.    Allergies  Review of patient's allergies  indicates no known allergies.  Home Medications   Current Outpatient Rx  Name  Route  Sig  Dispense  Refill  . acetaminophen (TYLENOL) 325 MG tablet   Oral   Take 650 mg by mouth every 6 (six) hours.         . celecoxib (CELEBREX) 200 MG capsule   Oral   Take 200 mg by mouth every 12 (twelve) hours.         . gabapentin (NEURONTIN) 100 MG capsule   Oral   Take 100 mg by mouth every 8 (eight) hours.         Marland Kitchen ibuprofen (ADVIL,MOTRIN) 200 MG tablet   Oral   Take 400 mg by mouth every 6 (six) hours as needed for pain.         Marland Kitchen oxycodone (OXY-IR) 5 MG capsule   Oral   Take 5-15 mg by mouth every 3 (three) hours as needed for pain.         Marland Kitchen senna (SENOKOT) 8.6 MG tablet   Oral   Take 2 tablets by mouth at bedtime as needed for constipation.         . cephALEXin (KEFLEX) 500 MG capsule   Oral   Take 1 capsule (500 mg total) by mouth 4 (four) times daily.   40 capsule   0  BP 143/91  Pulse 81  Temp(Src) 98.2 F (36.8 C) (Oral)  Resp 20  SpO2 100%  Physical Exam  Nursing note and vitals reviewed. Constitutional: She is oriented to person, place, and time. She appears well-developed and well-nourished. No distress.  HENT:  Head: Normocephalic and atraumatic.  Eyes: Conjunctivae are normal. Pupils are equal, round, and reactive to light.  Neck: Normal range of motion. Neck supple.  Cardiovascular: Normal rate, regular rhythm and normal heart sounds.   No murmur heard. Pulmonary/Chest: Effort normal. No respiratory distress. She has no wheezes.  Musculoskeletal:  [name] serous drainage from most proximal end of surgical scar.  Wound is clean dry and intact besides sutures at proximal end of wound. Small dressing at distal end of wound that is clean. There is no significant asymetry or erythema of knee joint. There is minimal tenderness when the knee is milked to remove serous fluid.  Neurological: She is alert and oriented to person, place, and time.   Skin: Skin is warm and dry.  Psychiatric: She has a normal mood and affect. Her behavior is normal.    ED Course  Procedures (including critical care time) DIAGNOSTIC STUDIES: Oxygen Saturation is 100% on room air, normal by my interpretation.    COORDINATION OF CARE: 1:27 PM Discussed ED treatment with pt and pt agrees.     Labs Reviewed  CBC WITH DIFFERENTIAL - Abnormal; Notable for the following:    Hemoglobin 11.9 (*)    HCT 34.6 (*)    All other components within normal limits  BASIC METABOLIC PANEL   Dg Knee Complete 4 Views Left  06/23/2012  *RADIOLOGY REPORT*  Clinical Data: Recent knee surgery, now leaking fluid from the wound.  LEFT KNEE - COMPLETE 4+ VIEW  Comparison: 05/27/2012.  Findings: Since the prior radiographs, the patient has undergone revision of the tibial component of the total knee replacement with a longer intramedullary stem placed.   There is moderate soft tissue swelling with air in the soft tissues. Concern is raised for infection.  Vascular calcification is present.  Fractures of the tibia and fibula show increasing resorption since the previous study.  IMPRESSION: Findings concerning for a soft tissue infection with air throughout the soft tissues.  Increasing resorption of the tibial and fibular fractures.   Original Report Authenticated By: Davonna Belling, M.D.      1. Post-operative complication, initial encounter       MDM  The drainage that is coming from the wound appears to be dark serous material, not purulent. There is tenderness over the tissues but I do not feel or hear any subcutaneous emphysema. She has very little range of motion of the knee for me, no obvious redness swelling or tenderness to touch over the knee joint.  There is no fever, no tachycardia, blood counts are pending at this time, Percocet given for pain  2:30 PM: I have personally viewed and interpreted the xrays of the L knee and find their to be some subcutaneous air -  persistent fracture of the proximal fibula and hardware of the distal femur and proximal tibia appears well situated.  3:20 PM: Discussed care with patient's orthopedist at [facility], [name] agrees the patient is unlikely to have an infection given her clinical status and informs me that her intraoperative cultures were negative and he believes this is just postoperative hematoma drainage and benign serous drainage. Based on my exam and the patient's history I would agree I feel that the  patient is stable to be discharged. Keflex started at the orthopedist's request. Patient informed of results and will followup as indicated.  Meds given in ED:  Medications  oxyCODONE-acetaminophen (PERCOCET/ROXICET) 5-325 MG per tablet 2 tablet (2 tablets Oral Given 06/23/12 1428)    New Prescriptions   CEPHALEXIN (KEFLEX) 500 MG CAPSULE    Take 1 capsule (500 mg total) by mouth 4 (four) times daily.      I personally performed the services described in this documentation, which was scribed in my presence. The recorded information has been reviewed and is accurate.        Jacqueline Roller, MD 06/23/12 [phone]

## 2012-07-19 ENCOUNTER — Ambulatory Visit: Payer: Self-pay | Attending: Orthopedic Surgery | Admitting: Physical Therapy

## 2012-07-19 DIAGNOSIS — R262 Difficulty in walking, not elsewhere classified: Secondary | ICD-10-CM | POA: Insufficient documentation

## 2012-07-19 DIAGNOSIS — M25569 Pain in unspecified knee: Secondary | ICD-10-CM | POA: Insufficient documentation

## 2012-07-19 DIAGNOSIS — IMO0001 Reserved for inherently not codable concepts without codable children: Secondary | ICD-10-CM | POA: Insufficient documentation

## 2012-07-19 DIAGNOSIS — M6281 Muscle weakness (generalized): Secondary | ICD-10-CM | POA: Insufficient documentation

## 2012-07-25 ENCOUNTER — Ambulatory Visit: Payer: Self-pay | Attending: Orthopedic Surgery | Admitting: Physical Therapy

## 2012-07-25 DIAGNOSIS — M6281 Muscle weakness (generalized): Secondary | ICD-10-CM | POA: Insufficient documentation

## 2012-07-25 DIAGNOSIS — M25569 Pain in unspecified knee: Secondary | ICD-10-CM | POA: Insufficient documentation

## 2012-07-25 DIAGNOSIS — IMO0001 Reserved for inherently not codable concepts without codable children: Secondary | ICD-10-CM | POA: Insufficient documentation

## 2012-07-25 DIAGNOSIS — R262 Difficulty in walking, not elsewhere classified: Secondary | ICD-10-CM | POA: Insufficient documentation

## 2012-07-28 ENCOUNTER — Ambulatory Visit: Payer: Self-pay | Admitting: Physical Therapy

## 2012-08-01 ENCOUNTER — Ambulatory Visit: Payer: Self-pay | Admitting: Physical Therapy

## 2012-08-04 ENCOUNTER — Encounter: Payer: Self-pay | Admitting: Physical Therapy

## 2012-08-22 ENCOUNTER — Ambulatory Visit: Payer: Self-pay | Attending: Orthopedic Surgery | Admitting: Physical Therapy

## 2012-08-22 DIAGNOSIS — M25569 Pain in unspecified knee: Secondary | ICD-10-CM | POA: Insufficient documentation

## 2012-08-22 DIAGNOSIS — R262 Difficulty in walking, not elsewhere classified: Secondary | ICD-10-CM | POA: Insufficient documentation

## 2012-08-22 DIAGNOSIS — IMO0001 Reserved for inherently not codable concepts without codable children: Secondary | ICD-10-CM | POA: Insufficient documentation

## 2012-08-22 DIAGNOSIS — M6281 Muscle weakness (generalized): Secondary | ICD-10-CM | POA: Insufficient documentation

## 2012-08-25 ENCOUNTER — Ambulatory Visit: Payer: Self-pay | Admitting: Physical Therapy

## 2012-08-29 ENCOUNTER — Ambulatory Visit: Payer: Self-pay | Admitting: Physical Therapy

## 2012-09-01 ENCOUNTER — Ambulatory Visit: Payer: Self-pay | Admitting: Physical Therapy

## 2012-09-01 ENCOUNTER — Encounter: Payer: Self-pay | Admitting: Physical Therapy

## 2012-09-06 ENCOUNTER — Ambulatory Visit: Payer: Self-pay | Admitting: Physical Therapy

## 2014-05-25 ENCOUNTER — Other Ambulatory Visit (HOSPITAL_COMMUNITY): Payer: Self-pay | Admitting: Family Medicine

## 2014-05-25 DIAGNOSIS — Z1231 Encounter for screening mammogram for malignant neoplasm of breast: Secondary | ICD-10-CM

## 2014-05-30 ENCOUNTER — Inpatient Hospital Stay (HOSPITAL_COMMUNITY): Admission: RE | Admit: 2014-05-30 | Payer: Self-pay | Source: Ambulatory Visit

## 2014-06-06 ENCOUNTER — Ambulatory Visit (HOSPITAL_COMMUNITY)
Admission: RE | Admit: 2014-06-06 | Discharge: 2014-06-06 | Disposition: A | Discharge status: Home / Self Care | Payer: 59 | Source: Ambulatory Visit | Attending: Family Medicine | Admitting: Family Medicine

## 2014-06-06 DIAGNOSIS — Z1231 Encounter for screening mammogram for malignant neoplasm of breast: Secondary | ICD-10-CM | POA: Insufficient documentation

## 2014-06-08 ENCOUNTER — Ambulatory Visit
Admission: RE | Admit: 2014-06-08 | Discharge: 2014-06-08 | Disposition: A | Discharge status: Home / Self Care | Payer: 59 | Source: Ambulatory Visit | Attending: Orthopedic Surgery | Admitting: Orthopedic Surgery

## 2014-06-08 ENCOUNTER — Other Ambulatory Visit: Payer: Self-pay | Admitting: Orthopedic Surgery

## 2014-06-08 DIAGNOSIS — M5430 Sciatica, unspecified side: Secondary | ICD-10-CM

## 2014-06-08 DIAGNOSIS — M25519 Pain in unspecified shoulder: Secondary | ICD-10-CM

## 2014-11-28 ENCOUNTER — Emergency Department (HOSPITAL_COMMUNITY): Payer: 59

## 2014-11-28 ENCOUNTER — Emergency Department (HOSPITAL_COMMUNITY)
Admission: EM | Admit: 2014-11-28 | Discharge: 2014-11-28 | Disposition: A | Discharge status: Home / Self Care | Payer: Self-pay | Attending: Emergency Medicine | Admitting: Emergency Medicine

## 2014-11-28 ENCOUNTER — Encounter (HOSPITAL_COMMUNITY): Payer: Self-pay | Admitting: *Deleted

## 2014-11-28 DIAGNOSIS — Y998 Other external cause status: Secondary | ICD-10-CM | POA: Insufficient documentation

## 2014-11-28 DIAGNOSIS — W231XXA Caught, crushed, jammed, or pinched between stationary objects, initial encounter: Secondary | ICD-10-CM | POA: Insufficient documentation

## 2014-11-28 DIAGNOSIS — Z79899 Other long term (current) drug therapy: Secondary | ICD-10-CM | POA: Insufficient documentation

## 2014-11-28 DIAGNOSIS — S61211A Laceration without foreign body of left index finger without damage to nail, initial encounter: Secondary | ICD-10-CM | POA: Insufficient documentation

## 2014-11-28 DIAGNOSIS — Y9289 Other specified places as the place of occurrence of the external cause: Secondary | ICD-10-CM | POA: Insufficient documentation

## 2014-11-28 DIAGNOSIS — S61219A Laceration without foreign body of unspecified finger without damage to nail, initial encounter: Secondary | ICD-10-CM

## 2014-11-28 DIAGNOSIS — Y9389 Activity, other specified: Secondary | ICD-10-CM | POA: Insufficient documentation

## 2014-11-28 MED ORDER — IBUPROFEN 800 MG PO TABS
800.0000 mg | ORAL_TABLET | Freq: Once | ORAL | Status: AC
  Administered 2014-11-28: 800 mg via ORAL
  Filled 2014-11-28: qty 1

## 2014-11-28 NOTE — ED Provider Notes (Signed)
CSN: [id number]     Arrival date & time 11/28/14  1328 History   First MD Initiated Contact with Patient 11/28/14 1346     Chief Complaint  Patient presents with  . Finger Injury     (Consider location/radiation/quality/duration/timing/severity/associated sxs/prior Treatment) HPI   [name] is a 44 y.o. female who presents to the Emergency Department complaining of laceration to the left index finger that occurred after a crush injury.  Occurred just prior to arrival.  She c/o pain to the finger with bending.  She has applied pressure to controlled bleeding prior to arrival.  She denies inability to bend the finger, swelling, or numbness.  Td is up to date   History reviewed. No pertinent past medical history. Past Surgical History  Procedure Laterality Date  . Total knee arthroplasty      x 2  . Abdominal hysterectomy     No family history on file. Social History  Substance Use Topics  . Smoking status: Never Smoker   . Smokeless tobacco: None  . Alcohol Use: Yes     Comment: Occ   OB History    No data available     Review of Systems  Constitutional: Negative for fever and chills.  Musculoskeletal: Negative for back pain, joint swelling and arthralgias.  Skin: Positive for wound.       Laceration left index finger.  Neurological: Negative for dizziness, weakness and numbness.  Hematological: Does not bruise/bleed easily.  All other systems reviewed and are negative.     Allergies  Review of patient's allergies indicates no known allergies.  Home Medications   Prior to Admission medications   Medication Sig Start Date End Date Taking? Authorizing Provider  acetaminophen (TYLENOL) 325 MG tablet Take 650 mg by mouth every 6 (six) hours.   Yes Historical Provider, MD  Cholecalciferol (VITAMIN D PO) Take 1 tablet by mouth daily.   Yes Historical Provider, MD  gabapentin (NEURONTIN) 100 MG capsule Take 100 mg by mouth every 8 (eight) hours as needed (pain).     Yes Historical Provider, MD  ibuprofen (ADVIL,MOTRIN) 200 MG tablet Take 400 mg by mouth every 6 (six) hours as needed for pain.   Yes Historical Provider, MD  meclizine (ANTIVERT) 25 MG tablet Take 1 tablet by mouth 3 (three) times daily as needed (neuropathy).  09/11/14  Yes Historical Provider, MD  Multiple Vitamin (MULTIVITAMIN WITH MINERALS) TABS tablet Take 1 tablet by mouth daily.   Yes Historical Provider, MD  oxycodone (OXY-IR) 5 MG capsule Take 5-15 mg by mouth every 3 (three) hours as needed for pain.   Yes Historical Provider, MD  senna (SENOKOT) 8.6 MG tablet Take 2 tablets by mouth at bedtime as needed for constipation.   Yes Historical Provider, MD   BP 138/88 mmHg  Pulse 81  Temp(Src) 98.2 F (36.8 C) (Oral)  SpO2 99% Physical Exam  Constitutional: She is oriented to person, place, and time. She appears well-developed and well-nourished. No distress.  HENT:  Head: Normocephalic and atraumatic.  Cardiovascular: Normal rate, regular rhythm and intact distal pulses.   No murmur heard. Pulmonary/Chest: Effort normal and breath sounds normal. No respiratory distress.  Musculoskeletal: Normal range of motion. She exhibits no edema.       Left hand: She exhibits tenderness and laceration. She exhibits normal range of motion, no bony tenderness, normal two-point discrimination, normal capillary refill, no deformity and no swelling. Normal sensation noted. Normal strength noted. She exhibits no finger abduction.  Hands: 1 cm laceration to mid left index finger.  Bleeding controlled. No FB's.  Pt has full ROM of the finger, distal sensation intact   Neurological: She is alert and oriented to person, place, and time. She exhibits normal muscle tone. Coordination normal.  Skin: Skin is warm and dry.  Nursing note and vitals reviewed.   ED Course  Procedures (including critical care time) Labs Review Labs Reviewed - No data to display  Imaging Review Dg Finger Index  Left  11/28/2014   CLINICAL DATA:  Abrasion and laceration of the left index finger at the level of the PIP joint on the palmar side after a crush injury between a chair and door today. Initial encounter.  EXAM: LEFT INDEX FINGER 2+V  COMPARISON:  None.  FINDINGS: Skin defect is seen in the volar soft tissues at the level of PIP joint. No underlying fracture, dislocation or foreign body is identified. There is some osteophytosis about the DIP joint.  IMPRESSION: Laceration without underlying fracture or foreign body.   Electronically Signed   By: Drusilla Kanner M.D.   On: 11/28/2014 15:12   I have personally reviewed and evaluated these images and lab results as part of my medical decision-making.   EKG Interpretation None       LACERATION REPAIR Performed by: Patsey Pitstick L. Authorized by: Maxwell Caul Consent: Verbal consent obtained. Risks and benefits: risks, benefits and alternatives were discussed Consent given by: patient Patient identity confirmed: provided demographic data Prepped and Draped in normal sterile fashion Wound explored  Laceration Location: left index finger  Laceration Length: 1 cm  No Foreign Bodies seen or palpated  Anesthesia:none  Irrigation method: syringe Amount of cleaning: standard  Skin closure: sterile tape  Technique: topical application   Patient tolerance: Patient tolerated the procedure well with no immediate complications.  MDM   Final diagnoses:  Finger laceration, initial encounter    Superificial lac to the mid left finger.  Bleeding controlled.  No injuries seen to deep structures, NV intact. Splint applied.  Pain improved.  Advised to return for any signs of infection    Pauline Aus, PA-C 11/29/14 2111  Gilda Crease, MD 12/03/14 [phone]

## 2014-11-28 NOTE — ED Notes (Signed)
Laceration to left index finger. Caught in between wheel chair and door.

## 2015-03-23 ENCOUNTER — Emergency Department (HOSPITAL_COMMUNITY)
Admission: EM | Admit: 2015-03-23 | Discharge: 2015-03-23 | Disposition: A | Discharge status: Home / Self Care | Payer: 59 | Attending: Emergency Medicine | Admitting: Emergency Medicine

## 2015-03-23 ENCOUNTER — Encounter (HOSPITAL_COMMUNITY): Payer: Self-pay | Admitting: Emergency Medicine

## 2015-03-23 ENCOUNTER — Emergency Department (HOSPITAL_COMMUNITY): Payer: 59

## 2015-03-23 DIAGNOSIS — M25512 Pain in left shoulder: Secondary | ICD-10-CM | POA: Insufficient documentation

## 2015-03-23 DIAGNOSIS — Y998 Other external cause status: Secondary | ICD-10-CM | POA: Insufficient documentation

## 2015-03-23 DIAGNOSIS — Y9289 Other specified places as the place of occurrence of the external cause: Secondary | ICD-10-CM | POA: Insufficient documentation

## 2015-03-23 DIAGNOSIS — M542 Cervicalgia: Secondary | ICD-10-CM | POA: Insufficient documentation

## 2015-03-23 DIAGNOSIS — M546 Pain in thoracic spine: Secondary | ICD-10-CM | POA: Insufficient documentation

## 2015-03-23 DIAGNOSIS — Z79899 Other long term (current) drug therapy: Secondary | ICD-10-CM | POA: Insufficient documentation

## 2015-03-23 DIAGNOSIS — Y9389 Activity, other specified: Secondary | ICD-10-CM | POA: Insufficient documentation

## 2015-03-23 DIAGNOSIS — X58XXXA Exposure to other specified factors, initial encounter: Secondary | ICD-10-CM | POA: Insufficient documentation

## 2015-03-23 DIAGNOSIS — S29011A Strain of muscle and tendon of front wall of thorax, initial encounter: Secondary | ICD-10-CM | POA: Insufficient documentation

## 2015-03-23 HISTORY — DX: Dizziness and giddiness: R42

## 2015-03-23 LAB — CBC
HCT: 40 % (ref 36.0–46.0)
Hemoglobin: 13.8 g/dL (ref 12.0–15.0)
MCH: 30.3 pg (ref 26.0–34.0)
MCHC: 34.5 g/dL (ref 30.0–36.0)
MCV: 87.7 fL (ref 78.0–100.0)
PLATELETS: 314 10*3/uL (ref 150–400)
RBC: 4.56 MIL/uL (ref 3.87–5.11)
RDW: 12.7 % (ref 11.5–15.5)
WBC: 6.6 10*3/uL (ref 4.0–10.5)

## 2015-03-23 LAB — BASIC METABOLIC PANEL
Anion gap: 7 (ref 5–15)
BUN: 11 mg/dL (ref 6–20)
CALCIUM: 9.3 mg/dL (ref 8.9–10.3)
CO2: 31 mmol/L (ref 22–32)
CREATININE: 0.74 mg/dL (ref 0.44–1.00)
Chloride: 103 mmol/L (ref 101–111)
Glucose, Bld: 94 mg/dL (ref 65–99)
Potassium: 4.3 mmol/L (ref 3.5–5.1)
SODIUM: 141 mmol/L (ref 135–145)

## 2015-03-23 LAB — I-STAT TROPONIN, ED
TROPONIN I, POC: 0 ng/mL (ref 0.00–0.08)
TROPONIN I, POC: 0 ng/mL (ref 0.00–0.08)

## 2015-03-23 MED ORDER — METHOCARBAMOL 500 MG PO TABS: 1000.0000 mg | ORAL_TABLET | Freq: Three times a day (TID) | ORAL | Status: DC | PRN

## 2015-03-23 MED ORDER — METHOCARBAMOL 500 MG PO TABS
1000.0000 mg | ORAL_TABLET | Freq: Once | ORAL | Status: AC
  Administered 2015-03-23: 1000 mg via ORAL
  Filled 2015-03-23: qty 2

## 2015-03-23 MED ORDER — KETOROLAC TROMETHAMINE 60 MG/2ML IM SOLN
60.0000 mg | Freq: Once | INTRAMUSCULAR | Status: AC
  Administered 2015-03-23: 60 mg via INTRAMUSCULAR
  Filled 2015-03-23: qty 2

## 2015-03-23 MED ORDER — IBUPROFEN 600 MG PO TABS: 600.0000 mg | ORAL_TABLET | Freq: Four times a day (QID) | ORAL | Status: DC | PRN

## 2015-03-23 NOTE — ED Provider Notes (Signed)
CSN: [id number]     Arrival date & time 03/23/15  1239 History   First MD Initiated Contact with Patient 03/23/15 1553     Chief Complaint  Patient presents with  . Chest Pain     (Consider location/radiation/quality/duration/timing/severity/associated sxs/prior Treatment) HPI Patient has had several weeks of intermittent left-sided chest pain. The pain has been constant since yesterday evening. The pain is worse with movement and palpation. No shortness of breath or cough. No new lower extremity swelling or pain. No recent extended travel. 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 She is taking no medication for the discomfort. She also complains of similar pain in the left side of the neck and left thoracic region. Past Medical History  Diagnosis Date  . Vertigo    Past Surgical History  Procedure Laterality Date  . Total knee arthroplasty      x 2  . Abdominal hysterectomy     No family history on file. Social History  Substance Use Topics  . Smoking status: Never Smoker   . Smokeless tobacco: None  . Alcohol Use: Yes     Comment: Occ   OB History    No data available     Review of Systems  Constitutional: Negative for fever and chills.  Respiratory: Negative for shortness of breath.   Cardiovascular: Positive for chest pain. Negative for palpitations and leg swelling.  Gastrointestinal: Negative for nausea, vomiting and abdominal pain.  Musculoskeletal: Positive for myalgias, back pain and neck pain. Negative for arthralgias.  Skin: Negative for rash and wound.  Neurological: Negative for dizziness, weakness, light-headedness, numbness and headaches.  All other systems reviewed and are negative.     Allergies  Review of patient's allergies indicates no known allergies.  Home Medications   Prior to Admission medications   Medication Sig Start Date End Date Taking? Authorizing Provider  Multiple Vitamin (MULTIVITAMIN WITH MINERALS) TABS tablet Take 1 tablet  by mouth daily.   Yes Historical Provider, MD  OVER THE COUNTER MEDICATION Take 1-2 tablets by mouth daily as needed (pain). Back and body from the dollar store   Yes Historical Provider, MD  ibuprofen (ADVIL,MOTRIN) 600 MG tablet Take 1 tablet (600 mg total) by mouth every 6 (six) hours as needed. 03/23/15   Loren Raceravid Litzy Dicker, MD  methocarbamol (ROBAXIN) 500 MG tablet Take 2 tablets (1,000 mg total) by mouth every 8 (eight) hours as needed for muscle spasms. 03/23/15   Loren Raceravid Mary Hockey, MD   BP 130/71 mmHg  Pulse 74  Temp(Src) 97.5 F (36.4 C) (Oral)  Resp 18  Ht 5\' 4"  (1.626 m)  Wt 263 lb (119.296 kg)  BMI 45.12 kg/m2  SpO2 99% Physical Exam  Constitutional: She is oriented to person, place, and time. She appears well-developed and well-nourished. No distress.  HENT:  Head: Normocephalic and atraumatic.  Mouth/Throat: Oropharynx is clear and moist.  Eyes: EOM are normal. Pupils are equal, round, and reactive to light.  Neck: Normal range of motion. Neck supple.  Left trapezius tenderness with palpation. No midline cervical tenderness.  Cardiovascular: Normal rate and regular rhythm.  Exam reveals no gallop and no friction rub.   No murmur heard. Pulmonary/Chest: Effort normal and breath sounds normal. No respiratory distress. She has no wheezes. She has no rales. She exhibits tenderness (pain is reproduced with the left upper chest.).  Abdominal: Soft. Bowel sounds are normal. She exhibits no distension and no mass. There is no tenderness. There is no rebound and no guarding.  Musculoskeletal: Normal range of motion. She exhibits tenderness. She exhibits no edema.  Patient has tenderness diffusely over the left thoracic region specifically the medial border of the left scapula. There is no midline thoracic or lumbar tenderness. No evidence of trauma. No lower extremity swelling or pain. Still pulses intact and equal.  Neurological: She is alert and oriented to person, place, and time.   5/5 motor in all extremities. Sensation is fully intact.  Skin: Skin is warm and dry. No rash noted. No erythema.  Psychiatric: She has a normal mood and affect. Her behavior is normal.  Nursing note and vitals reviewed.   ED Course  Procedures (including critical care time) Labs Review Labs Reviewed  BASIC METABOLIC PANEL  CBC  I-STAT TROPOININ, ED  Rosezena Sensor, ED    Imaging Review Dg Chest 2 View  03/23/2015  CLINICAL DATA:  Left-sided chest pain. EXAM: CHEST  2 VIEW COMPARISON:  None. FINDINGS: The heart size and mediastinal contours are within normal limits. Both lungs are clear. The visualized skeletal structures are unremarkable. IMPRESSION: Normal exam. Electronically Signed   By: Francene Boyers M.D.   On: 03/23/2015 13:53   I have personally reviewed and evaluated these images and lab results as part of my medical decision-making.   EKG Interpretation   Date/Time:  Saturday March 23 2015 12:44:43 EST Ventricular Rate:  67 PR Interval:  186 QRS Duration: 84 QT Interval:  416 QTC Calculation: 439 R Axis:   38 Text Interpretation:  Normal sinus rhythm Normal ECG Confirmed by  Carylon Tamburro  MD, Sevrin Sally ([id number]) on 03/23/2015 3:55:15 PM      MDM   Final diagnoses:  Muscle strain of chest wall, initial encounter   Chest pain is easily reproduced with palpation. EKG without evidence of ischemia. Troponin 2 is negative. We'll treat symptomatically. Given patient does have some risk factors for coronary artery disease have given follow-up with cardiology. Return precautions given.     Loren Racer, MD 03/23/15 [id number]

## 2015-03-23 NOTE — Discharge Instructions (Signed)
Muscle Strain A muscle strain is an injury that occurs when a muscle is stretched beyond its normal length. Usually a small number of muscle fibers are torn when this happens. Muscle strain is rated in degrees. First-degree strains have the least amount of muscle fiber tearing and pain. Second-degree and third-degree strains have increasingly more tearing and pain.  Usually, recovery from muscle strain takes 1-2 weeks. Complete healing takes 5-6 weeks.  CAUSES  Muscle strain happens when a sudden, violent force placed on a muscle stretches it too far. This may occur with lifting, sports, or a fall.  RISK FACTORS Muscle strain is especially common in athletes.  SIGNS AND SYMPTOMS At the site of the muscle strain, there may be:  Pain.  Bruising.  Swelling.  Difficulty using the muscle due to pain or lack of normal function. DIAGNOSIS  Your health care provider will perform a physical exam and ask about your medical history. TREATMENT  Often, the best treatment for a muscle strain is resting, icing, and applying cold compresses to the injured area.  HOME CARE INSTRUCTIONS   Use the PRICE method of treatment to promote muscle healing during the first 2-3 days after your injury. The PRICE method involves:  Protecting the muscle from being injured again.  Restricting your activity and resting the injured body part.  Icing your injury. To do this, put ice in a plastic bag. Place a towel between your skin and the bag. Then, apply the ice and leave it on from 15-20 minutes each hour. After the third day, switch to moist heat packs.  Apply compression to the injured area with a splint or elastic bandage. Be careful not to wrap it too tightly. This may interfere with blood circulation or increase swelling.  Elevate the injured body part above the level of your heart as often as you can.  Only take over-the-counter or prescription medicines for pain, discomfort, or fever as directed by your  health care provider.  Warming up prior to exercise helps to prevent future muscle strains. SEEK MEDICAL CARE IF:   You have increasing pain or swelling in the injured area.  You have numbness, tingling, or a significant loss of strength in the injured area. MAKE SURE YOU:   Understand these instructions.  Will watch your condition.  Will get help right away if you are not doing well or get worse.   This information is not intended to replace advice given to you by your health care provider. Make sure you discuss any questions you have with your health care provider.   Document Released: 03/09/2005 Document Revised: 12/28/2012 Document Reviewed: 10/06/2012 Elsevier Interactive Patient Education 2016 Elsevier Inc. Chest Wall Pain Chest wall pain is pain in or around the bones and muscles of your chest. Sometimes, an injury causes this pain. Sometimes, the cause may not be known. This pain may take several weeks or longer to get better. HOME CARE INSTRUCTIONS  Pay attention to any changes in your symptoms. Take these actions to help with your pain:   Rest as told by your health care provider.   Avoid activities that cause pain. These include any activities that use your chest muscles or your abdominal and side muscles to lift heavy items.   If directed, apply ice to the painful area:  Put ice in a plastic bag.  Place a towel between your skin and the bag.  Leave the ice on for 20 minutes, 2-3 times per day.  Take over-the-counter and  prescription medicines only as told by your health care provider.  Do not use tobacco products, including cigarettes, chewing tobacco, and e-cigarettes. If you need help quitting, ask your health care provider.  Keep all follow-up visits as told by your health care provider. This is important. SEEK MEDICAL CARE IF:  You have a fever.  Your chest pain becomes worse.  You have new symptoms. SEEK IMMEDIATE MEDICAL CARE IF:  You have  nausea or vomiting.  You feel sweaty or light-headed.  You have a cough with phlegm (sputum) or you cough up blood.  You develop shortness of breath.   This information is not intended to replace advice given to you by your health care provider. Make sure you discuss any questions you have with your health care provider.   Document Released: 03/09/2005 Document Revised: 11/28/2014 Document Reviewed: 06/04/2014 Elsevier Interactive Patient Education [employer] Elsevier Inc[employer]

## 2015-03-23 NOTE — ED Notes (Signed)
Patient states chest pains with radiation to neck and back x 2 weeks intermittently.   Had some dizziness previously, denies today.   Denies other symptoms.

## 2015-11-12 ENCOUNTER — Telehealth: Payer: Self-pay | Admitting: Orthopedic Surgery

## 2015-11-12 NOTE — Telephone Encounter (Signed)
I need to see some records

## 2015-11-12 NOTE — Telephone Encounter (Signed)
Patient called, requests appointment for problem of left shoulder and arm, and for right knee. Said she had been seeing [place]z [place]arter [place], who has retired - said never was able to get notes, but [facility]. States she's also had total knee [facility] of left knee, [facility].  Please review Xrays in system and please advise if she may schedule. Her ph# is [phone].

## 2015-11-14 NOTE — Telephone Encounter (Signed)
Called back to patient 8/23 - 11/14/15; phone does not take messages.

## 2015-11-19 ENCOUNTER — Other Ambulatory Visit: Payer: Self-pay | Admitting: Family Medicine

## 2015-11-19 ENCOUNTER — Other Ambulatory Visit (HOSPITAL_COMMUNITY)
Admission: RE | Admit: 2015-11-19 | Discharge: 2015-11-19 | Disposition: A | Discharge status: Home / Self Care | Source: Ambulatory Visit | Attending: Family Medicine | Admitting: Family Medicine

## 2015-11-19 DIAGNOSIS — Z01419 Encounter for gynecological examination (general) (routine) without abnormal findings: Secondary | ICD-10-CM | POA: Diagnosis present

## 2015-11-19 DIAGNOSIS — Z1151 Encounter for screening for human papillomavirus (HPV): Secondary | ICD-10-CM | POA: Diagnosis present

## 2015-11-20 ENCOUNTER — Other Ambulatory Visit (HOSPITAL_COMMUNITY): Payer: Self-pay | Admitting: Family Medicine

## 2015-11-20 DIAGNOSIS — Z1231 Encounter for screening mammogram for malignant neoplasm of breast: Secondary | ICD-10-CM

## 2015-11-21 LAB — CYTOLOGY - PAP

## 2015-11-27 NOTE — Telephone Encounter (Signed)
Unable to reach patient as noted.  No further response.

## 2015-11-28 ENCOUNTER — Ambulatory Visit (HOSPITAL_COMMUNITY): Payer: Self-pay

## 2015-12-16 ENCOUNTER — Ambulatory Visit (HOSPITAL_COMMUNITY)
Admission: RE | Admit: 2015-12-16 | Discharge: 2015-12-16 | Disposition: A | Discharge status: Home / Self Care | Source: Ambulatory Visit | Attending: Family Medicine | Admitting: Family Medicine

## 2015-12-16 DIAGNOSIS — Z1231 Encounter for screening mammogram for malignant neoplasm of breast: Secondary | ICD-10-CM | POA: Insufficient documentation

## 2016-06-02 ENCOUNTER — Emergency Department (HOSPITAL_COMMUNITY)
Admission: EM | Admit: 2016-06-02 | Discharge: 2016-06-02 | Disposition: A | Discharge status: Home / Self Care | Attending: Emergency Medicine | Admitting: Emergency Medicine

## 2016-06-02 ENCOUNTER — Emergency Department (HOSPITAL_COMMUNITY)

## 2016-06-02 ENCOUNTER — Encounter (HOSPITAL_COMMUNITY): Payer: Self-pay | Admitting: Emergency Medicine

## 2016-06-02 DIAGNOSIS — Z96652 Presence of left artificial knee joint: Secondary | ICD-10-CM | POA: Diagnosis not present

## 2016-06-02 DIAGNOSIS — S8011XA Contusion of right lower leg, initial encounter: Secondary | ICD-10-CM

## 2016-06-02 DIAGNOSIS — Y9289 Other specified places as the place of occurrence of the external cause: Secondary | ICD-10-CM | POA: Diagnosis not present

## 2016-06-02 DIAGNOSIS — S8991XA Unspecified injury of right lower leg, initial encounter: Secondary | ICD-10-CM | POA: Diagnosis present

## 2016-06-02 DIAGNOSIS — Y999 Unspecified external cause status: Secondary | ICD-10-CM | POA: Insufficient documentation

## 2016-06-02 DIAGNOSIS — W000XXA Fall on same level due to ice and snow, initial encounter: Secondary | ICD-10-CM | POA: Diagnosis not present

## 2016-06-02 DIAGNOSIS — Y9301 Activity, walking, marching and hiking: Secondary | ICD-10-CM | POA: Diagnosis not present

## 2016-06-02 MED ORDER — OXYCODONE-ACETAMINOPHEN 5-325 MG PO TABS
1.0000 | ORAL_TABLET | Freq: Once | ORAL | Status: AC
  Administered 2016-06-02: 1 via ORAL
  Filled 2016-06-02: qty 1

## 2016-06-02 MED ORDER — HYDROCODONE-ACETAMINOPHEN 5-325 MG PO TABS: 1.0000 | ORAL_TABLET | Freq: Four times a day (QID) | ORAL | 0 refills | Status: DC | PRN

## 2016-06-02 MED ORDER — HYDROCODONE-ACETAMINOPHEN 5-325 MG PO TABS: 2.0000 | ORAL_TABLET | Freq: Four times a day (QID) | ORAL | 0 refills | Status: DC | PRN

## 2016-06-02 NOTE — Progress Notes (Signed)
Orthopedic Tech Progress Note Patient Details:  Jacqueline MorganSharon E Callander 1970/06/16 [id number]  Ortho Devices Type of Ortho Device: Knee Sleeve, Crutches Ortho Device/Splint Location: Applied Crutches and Knee sleeve to Pt LEFT KNEE.  Pt informed me that Left knee was the injuried knee.  (NOT RIGHT KNEE). I looked at Xrays to confirm proper leg.  Pt tolerated knee sleeve application and crutches training.  Pt previously used crutches.  Ortho Device/Splint Interventions: Application, Adjustment   Alvina ChouWilliams, Geovanna Simko C 06/02/2016, 7:38 PM

## 2016-06-02 NOTE — Discharge Instructions (Signed)
Rest - please stay off knee as much as possible for the next couple days. You can put weight on it as tolerated Ice - ice for 20 minutes at a time, several times a day Compression - wear brace to provide support Elevate - elevate ankle above level of heart Ibuprofen - take with food. Take up to 3-4 times daily

## 2016-06-02 NOTE — ED Provider Notes (Signed)
[facility]PT Provider Note   CSN: [id number] Arrival date & time: 06/02/16  1620  By signing my name below, I, [name], attest that this documentation has been prepared under the direction and in the presence of Jacqueline HartKelly Maryori Weide, PA-C . Electronically Signed: Majel HomerPeyton Fritz, Scribe. 06/02/2016. 5:58 PM.  History   Chief Complaint Chief Complaint  Patient presents with  . Leg Pain   The history is provided by the patient. No language interpreter was used.   HPI Comments: [name] is a 46 y.o. female who presents to the Emergency Department complaining of gradually worsening, left leg pain s/p a fall that occurred at ~12:00 PM this afternoon. Pt reports she was walking this afternoon when she suddenly "slipped" on snow, causing her to fall onto her left side. She states her leg "fell under me and I had to get it out from underneath me." She notes her pain is centralized to her left shin and radiates into her left foot with associated chronic swelling in her calf. Pt reports she was initially able to stand and ambulate after her fall but states she is now unable to bear any weight due to pain. She notes PSHx of left total knee arthroplasty ~20 years ago and states she fractured her left tibia/fibula in a fall in 2014 in which she required a revision total knee arthroplasty. She states her current pain is "not as bad" as it was during her previous fracture. Pt denies any other complaints.   Past Medical History:  Diagnosis Date  . Vertigo    Patient Active Problem List   Diagnosis Date Noted  . KNEE SPRAIN 09/16/2009   Past Surgical History:  Procedure Laterality Date  . ABDOMINAL HYSTERECTOMY    . TOTAL KNEE ARTHROPLASTY     x 2    OB History    No data available     Home Medications    Prior to Admission medications   Medication Sig Start Date End Date Taking? Authorizing Provider  ibuprofen (ADVIL,MOTRIN) 600 MG tablet Take 1 tablet (600 mg total) by mouth every 6  (six) hours as needed. 03/23/15   Loren Raceravid Yelverton, MD  methocarbamol (ROBAXIN) 500 MG tablet Take 2 tablets (1,000 mg total) by mouth every 8 (eight) hours as needed for muscle spasms. 03/23/15   Loren Raceravid Yelverton, MD  Multiple Vitamin (MULTIVITAMIN WITH MINERALS) TABS tablet Take 1 tablet by mouth daily.    Historical Provider, MD  OVER THE COUNTER MEDICATION Take 1-2 tablets by mouth daily as needed (pain). Back and body from the dollar store    Historical Provider, MD    Family History No family history on file.  Social History Social History  Substance Use Topics  . Smoking status: Never Smoker  . Smokeless tobacco: Not on file  . Alcohol use Yes     Comment: Occ   Allergies   Patient has no known allergies.  Review of Systems Review of Systems  Constitutional: Negative for fever.  Musculoskeletal: Positive for arthralgias and joint swelling.   Physical Exam Updated Vital Signs BP 155/90   Pulse 67   Temp 99.5 F (37.5 C) (Oral)   Resp 16   SpO2 100%   Physical Exam  Constitutional: She is oriented to person, place, and time. She appears well-developed and well-nourished.  HENT:  Head: Normocephalic.  Eyes: EOM are normal.  Neck: Normal range of motion.  Pulmonary/Chest: Effort normal.  Abdominal: She exhibits no distension.  Musculoskeletal: She exhibits tenderness.  Large surgical scar from left knee to mid-shin that is well-healed. Chronic swelling in the calf. TTP over anterior and lateral shin, no tenderness to the knee. She is unable to bear any weight. Neurovascularly intact.   Neurological: She is alert and oriented to person, place, and time.  Psychiatric: She has a normal mood and affect.  Nursing note and vitals reviewed.  ED Treatments / Results  DIAGNOSTIC STUDIES:  Oxygen Saturation is 100% on RA, normal by my interpretation.    COORDINATION OF CARE:  5:53 PM Discussed treatment plan with pt at bedside and pt agreed to plan.  Labs (all labs  ordered are listed, but only abnormal results are displayed) Labs Reviewed - No data to display  EKG  EKG Interpretation None       Radiology Dg Tibia/fibula Left  Result Date: 06/02/2016 CLINICAL DATA:  Left leg pain after fall EXAM: LEFT TIBIA AND FIBULA - 2 VIEW COMPARISON:  Left lower extremity radiographs 06/23/2012 and 05/27/2012 FINDINGS: Left total knee arthroplasty components articulate normally after well-seated. No abnormal perihardware lucency. There healed fractures of the proximal tibia and fibula in anatomic alignment. There is no acute fracture or dislocation of the tibia or fibula. IMPRESSION: Left total knee arthroplasty and remote posttraumatic findings without acute osseous abnormality of the tibia or fibula. Electronically Signed   By: Deatra Robinson M.D.   On: 06/02/2016 18:40   Dg Knee Complete 4 Views Left  Result Date: 06/02/2016 CLINICAL DATA:  Knee pain after fall EXAM: LEFT KNEE - COMPLETE 4+ VIEW COMPARISON:  Left knee radiograph 06/23/2012 FINDINGS: Status post total knee arthroplasty. No abnormal lucency surrounding the hardware. Healed remote fractures of the proximal tibia and fibula are again seen. There is no acute fracture. No knee effusion. IMPRESSION: 1. No focal hardware abnormality or acute osseous injury of the left knee. 2. Remote posttraumatic findings. Electronically Signed   By: Deatra Robinson M.D.   On: 06/02/2016 18:36    Procedures Procedures (including critical care time)  Medications Ordered in ED Medications - No data to display  Initial Impression / Assessment and Plan / ED Course  I have reviewed the triage vital signs and the nursing notes.  Pertinent labs & imaging results that were available during my care of the patient were reviewed by me and considered in my medical decision making (see chart for details).  46 year old female with acute on chronic left knee/leg pain after a fall. Xrays are negative. Provided pain control,  crutches, and knee sleeve. Return precautions given.  I personally performed the services described in this documentation, which was scribed in my presence. The recorded information has been reviewed and is accurate.   Final Clinical Impressions(s) / ED Diagnoses   Final diagnoses:  Contusion of right leg, initial encounter    New Prescriptions New Prescriptions   No medications on file     Bethel Born, PA-C 06/05/16 1445    Mancel Bale, MD 06/06/16 [phone]

## 2016-06-02 NOTE — ED Notes (Signed)
Ortho tech in to apply knee immobilizer and crutches.

## 2016-06-02 NOTE — ED Notes (Signed)
Pt still in x-ray

## 2016-06-02 NOTE — ED Triage Notes (Signed)
Pt reports slipping on snow around 1200 today, states she has metal in leg from previous surgery, swelling and pain to left knee and lower left leg.

## 2016-06-02 NOTE — ED Notes (Signed)
Patient transported to X-ray 

## 2016-06-02 NOTE — ED Notes (Signed)
Hx of left TKR x 2. Fell today, c/o left knee pain.

## 2016-06-02 NOTE — ED Notes (Signed)
Pt taken to restroom in w/c and assisted to toilet.

## 2016-06-02 NOTE — ED Notes (Signed)
Pt remains in xray.

## 2016-11-02 ENCOUNTER — Emergency Department (HOSPITAL_COMMUNITY)
Admission: EM | Admit: 2016-11-02 | Discharge: 2016-11-02 | Disposition: A | Discharge status: Home / Self Care | Attending: Emergency Medicine | Admitting: Emergency Medicine

## 2016-11-02 ENCOUNTER — Encounter (HOSPITAL_COMMUNITY): Payer: Self-pay | Admitting: Nurse Practitioner

## 2016-11-02 DIAGNOSIS — N898 Other specified noninflammatory disorders of vagina: Secondary | ICD-10-CM | POA: Insufficient documentation

## 2016-11-02 MED ORDER — DOXYCYCLINE HYCLATE 100 MG PO CAPS: 100.0000 mg | ORAL_CAPSULE | Freq: Two times a day (BID) | ORAL | 0 refills | Status: DC

## 2016-11-02 NOTE — Discharge Instructions (Signed)
It was my pleasure taking care of you today!   Warm soaks to the area twice daily. Please take all of your antibiotics until finished!  If symptoms persist or worsen, please follow up with your OBGYN.  Return to ER for new or concerning symptoms.

## 2016-11-02 NOTE — ED Triage Notes (Signed)
Pt presents with c/o mass to vagina. She can feel a soft mass to right upper vaginal area. She first noticed the mass on Thursday. The mass is mildly painful without drainage.

## 2016-11-02 NOTE — ED Notes (Signed)
Patient Alert and oriented X4. Stable and ambulatory. Patient verbalized understanding of the discharge instructions.  Patient belongings were taken by the patient.  

## 2016-11-02 NOTE — ED Provider Notes (Signed)
[facility] Provider Note   CSN: [id number] Arrival date & time: 11/02/16  1340     History   Chief Complaint Chief Complaint  Patient presents with  . Mass    HPI [name] is a 46 y.o. female.  The history is provided by the patient and medical records. No language interpreter was used.   [name] is a 46 y.o. female  with no pertinent PMH who presents to the Emergency Department complaining of progressively worsening nodule to the inner aspect of right vaginal lip region. Patient first noticed a little tenderness 5 days ago, but did not feel anything in the area. 3 days ago, a small knot appeared and this knot has continued to grow in size. Feels uncomfortable and a little tender to the touch. No drainage. No hx of similar. Married with husband as sole sexual partner for several years. No concern for STD's. No vaginal discharge, abdominal pain, n/v, fevers or rashes elsewhere. No history of similar.   Past Medical History:  Diagnosis Date  . Vertigo     Patient Active Problem List   Diagnosis Date Noted  . KNEE SPRAIN 09/16/2009    Past Surgical History:  Procedure Laterality Date  . ABDOMINAL HYSTERECTOMY    . TOTAL KNEE ARTHROPLASTY     x 2    OB History    No data available       Home Medications    Prior to Admission medications   Medication Sig Start Date End Date Taking? Authorizing Provider  doxycycline (VIBRAMYCIN) 100 MG capsule Take 1 capsule (100 mg total) by mouth 2 (two) times daily. 11/02/16   Ward, Chase PicketJaime Pilcher, PA-C  HYDROcodone-acetaminophen (NORCO/VICODIN) 5-325 MG tablet Take 1-2 tablets by mouth every 6 (six) hours as needed for severe pain. 06/02/16   Bethel BornGekas, Kelly Marie, PA-C  ibuprofen (ADVIL,MOTRIN) 600 MG tablet Take 1 tablet (600 mg total) by mouth every 6 (six) hours as needed. 03/23/15   Loren RacerYelverton, David, MD  methocarbamol (ROBAXIN) 500 MG tablet Take 2 tablets (1,000 mg total) by mouth every 8 (eight) hours as  needed for muscle spasms. 03/23/15   Loren RacerYelverton, David, MD  Multiple Vitamin (MULTIVITAMIN WITH MINERALS) TABS tablet Take 1 tablet by mouth daily.    [provider]  OVER THE COUNTER MEDICATION Take 1-2 tablets by mouth daily as needed (pain). Back and body from the dollar store    [provider]    Family History History reviewed. No pertinent family history.  Social History Social History  Substance Use Topics  . Smoking status: Never Smoker  . Smokeless tobacco: Never Used  . Alcohol use Yes     Comment: Occ     Allergies   Patient has no known allergies.   Review of Systems Review of Systems  Genitourinary: Positive for genital sores and vaginal pain. Negative for vaginal bleeding and vaginal discharge.  All other systems reviewed and are negative.    Physical Exam Updated Vital Signs BP (!) 149/96   Pulse 62   Temp 98.1 F (36.7 C) (Oral)   Resp 18   Ht 5\' 6"  (1.676 m)   Wt 117.9 kg (260 lb)   SpO2 96%   BMI 41.97 kg/m   Physical Exam  Constitutional: She is oriented to person, place, and time. She appears well-developed and well-nourished. No distress.  HENT:  Head: Normocephalic and atraumatic.  Cardiovascular: Normal rate, regular rhythm and normal heart sounds.   No murmur heard.  Pulmonary/Chest: Effort normal and breath sounds normal. No respiratory distress.  Abdominal: Soft. She exhibits no distension. There is no tenderness.  Genitourinary:  Genitourinary Comments: 1 cm firm nodule to upper labia majora with mild tenderness to palpation. No surrounding erythema. No active drainage.  Neurological: She is alert and oriented to person, place, and time.  Skin: Skin is warm and dry.  Nursing note and vitals reviewed.    ED Treatments / Results  Labs (all labs ordered are listed, but only abnormal results are displayed) Labs Reviewed - No data to display  EKG  EKG Interpretation None       Radiology No results  found.  Procedures Procedures (including critical care time)  Medications Ordered in ED Medications - No data to display   Initial Impression / Assessment and Plan / ED Course  I have reviewed the triage vital signs and the nursing notes.  Pertinent labs & imaging results that were available during my care of the patient were reviewed by me and considered in my medical decision making (see chart for details).    [name] is a 46 y.o. female who presents to ED for 1 cm firm nodule to upper inner aspect of labia majora. Does not appear c/w Bartholin's. No other lesions noted on GU exam. No active drainage. Do not feel like area needs to be I&D at this time. Will treat with doxy and have patient do warm soaks. GYN follow up if symptoms persist. Reasons to return to ER discussed and all questions answered.   Patient discussed with Dr. Judd Lien who agrees with treatment plan.    Final Clinical Impressions(s) / ED Diagnoses   Final diagnoses:  Vaginal lesion    New Prescriptions Discharge Medication List as of 11/02/2016  3:50 PM    START taking these medications   Details  doxycycline (VIBRAMYCIN) 100 MG capsule Take 1 capsule (100 mg total) by mouth 2 (two) times daily., Starting Mon 11/02/2016, Print         Ward, Chase Picket, PA-C 11/02/16 1631    Geoffery Lyons, MD 11/02/16 1950

## 2016-11-03 ENCOUNTER — Ambulatory Visit: Attending: Orthopedic Surgery | Admitting: Physical Therapy

## 2017-01-25 ENCOUNTER — Other Ambulatory Visit (HOSPITAL_COMMUNITY): Payer: Self-pay | Admitting: Family Medicine

## 2017-01-25 DIAGNOSIS — Z1231 Encounter for screening mammogram for malignant neoplasm of breast: Secondary | ICD-10-CM

## 2017-01-27 ENCOUNTER — Ambulatory Visit (HOSPITAL_COMMUNITY)
Admission: RE | Admit: 2017-01-27 | Discharge: 2017-01-27 | Disposition: A | Discharge status: Home / Self Care | Source: Ambulatory Visit | Attending: Family Medicine | Admitting: Family Medicine

## 2017-01-27 DIAGNOSIS — Z1231 Encounter for screening mammogram for malignant neoplasm of breast: Secondary | ICD-10-CM | POA: Diagnosis present

## 2018-03-07 ENCOUNTER — Other Ambulatory Visit (HOSPITAL_COMMUNITY): Payer: Self-pay | Admitting: Family Medicine

## 2018-03-07 DIAGNOSIS — Z1231 Encounter for screening mammogram for malignant neoplasm of breast: Secondary | ICD-10-CM

## 2018-03-24 ENCOUNTER — Ambulatory Visit (HOSPITAL_COMMUNITY): Payer: Self-pay

## 2018-04-04 ENCOUNTER — Ambulatory Visit (HOSPITAL_COMMUNITY): Payer: Self-pay

## 2018-04-22 ENCOUNTER — Ambulatory Visit (HOSPITAL_COMMUNITY): Payer: Self-pay

## 2018-04-25 ENCOUNTER — Ambulatory Visit (HOSPITAL_COMMUNITY)
Admission: RE | Admit: 2018-04-25 | Discharge: 2018-04-25 | Disposition: A | Discharge status: Home / Self Care | Source: Ambulatory Visit | Attending: Family Medicine | Admitting: Family Medicine

## 2018-04-25 DIAGNOSIS — Z1231 Encounter for screening mammogram for malignant neoplasm of breast: Secondary | ICD-10-CM | POA: Diagnosis present

## 2018-12-08 ENCOUNTER — Other Ambulatory Visit: Payer: Self-pay

## 2018-12-08 DIAGNOSIS — Z20822 Contact with and (suspected) exposure to covid-19: Secondary | ICD-10-CM

## 2018-12-10 LAB — NOVEL CORONAVIRUS, NAA: SARS-CoV-2, NAA: NOT DETECTED

## 2018-12-12 ENCOUNTER — Telehealth: Payer: Self-pay | Admitting: Family Medicine

## 2018-12-12 NOTE — Telephone Encounter (Signed)
Patient was told the Covid test results were Negative, Not Detected.

## 2019-04-19 ENCOUNTER — Other Ambulatory Visit (HOSPITAL_COMMUNITY): Payer: Self-pay | Admitting: Family Medicine

## 2019-04-19 DIAGNOSIS — Z1231 Encounter for screening mammogram for malignant neoplasm of breast: Secondary | ICD-10-CM

## 2019-04-28 ENCOUNTER — Other Ambulatory Visit: Payer: Self-pay

## 2019-04-28 ENCOUNTER — Ambulatory Visit (HOSPITAL_COMMUNITY)
Admission: RE | Admit: 2019-04-28 | Discharge: 2019-04-28 | Disposition: A | Discharge status: Home / Self Care | Source: Ambulatory Visit | Attending: Family Medicine | Admitting: Family Medicine

## 2019-04-28 DIAGNOSIS — Z1231 Encounter for screening mammogram for malignant neoplasm of breast: Secondary | ICD-10-CM | POA: Insufficient documentation

## 2020-08-26 ENCOUNTER — Ambulatory Visit: Admitting: Podiatry

## 2020-08-28 ENCOUNTER — Other Ambulatory Visit: Payer: Self-pay

## 2020-08-28 ENCOUNTER — Encounter: Payer: Self-pay | Admitting: Podiatry

## 2020-08-28 ENCOUNTER — Ambulatory Visit (INDEPENDENT_AMBULATORY_CARE_PROVIDER_SITE_OTHER): Admitting: Podiatry

## 2020-08-28 DIAGNOSIS — M2041 Other hammer toe(s) (acquired), right foot: Secondary | ICD-10-CM

## 2020-08-28 DIAGNOSIS — R52 Pain, unspecified: Secondary | ICD-10-CM

## 2020-08-28 DIAGNOSIS — Q828 Other specified congenital malformations of skin: Secondary | ICD-10-CM

## 2020-08-28 NOTE — Progress Notes (Signed)
  Subjective:  Patient ID: [name], female    DOB: 03/14/1971,  MRN: [id number]  Chief Complaint  Patient presents with  . Callouses    Patient presents today for painful callus lesions bottom of left forefoot x 3-4 years off and on    50 y.o. female presents with the above complaint. History confirmed with patient.  Also has corns on the fifth toes that are nonpainful  Objective:  Physical Exam: warm, good capillary refill, no trophic changes or ulcerative lesions, normal DP and PT pulses and normal sensory exam.  Bilateral fifth toe hammertoe and corns Left Foot: Submetatarsal 5 and 3 porokeratosis Assessment:   1. Porokeratosis   2. Hammertoe of right foot   3. Pain      Plan:  Patient was evaluated and treated and all questions answered.  All symptomatic hyperkeratoses were safely debrided with a sterile #15 blade to patient's level of comfort without incident. We discussed preventative and palliative care of these lesions including supportive and accommodative shoegear, padding, prefabricated and custom molded accommodative orthoses, use of a pumice stone and lotions/creams daily.  Cantharone ointment was applied to the lesions as well.  Post care instructions were given.   We also discussed treatment of hammertoe deformities which are causing her corns.  They are currently painful.  Return as needed for this  Return if symptoms worsen or fail to improve.

## 2020-08-30 ENCOUNTER — Ambulatory Visit: Admitting: Podiatry

## 2021-02-17 ENCOUNTER — Other Ambulatory Visit (HOSPITAL_COMMUNITY): Payer: Self-pay | Admitting: Family Medicine

## 2021-02-17 DIAGNOSIS — Z1231 Encounter for screening mammogram for malignant neoplasm of breast: Secondary | ICD-10-CM

## 2021-02-18 ENCOUNTER — Other Ambulatory Visit: Payer: Self-pay

## 2021-02-18 ENCOUNTER — Emergency Department (HOSPITAL_COMMUNITY)
Admission: EM | Admit: 2021-02-18 | Discharge: 2021-02-19 | Disposition: A | Discharge status: Home / Self Care | Attending: Emergency Medicine | Admitting: Emergency Medicine

## 2021-02-18 DIAGNOSIS — M549 Dorsalgia, unspecified: Secondary | ICD-10-CM | POA: Insufficient documentation

## 2021-02-18 DIAGNOSIS — N390 Urinary tract infection, site not specified: Secondary | ICD-10-CM

## 2021-02-18 DIAGNOSIS — Z96653 Presence of artificial knee joint, bilateral: Secondary | ICD-10-CM | POA: Diagnosis not present

## 2021-02-18 DIAGNOSIS — R591 Generalized enlarged lymph nodes: Secondary | ICD-10-CM | POA: Insufficient documentation

## 2021-02-18 DIAGNOSIS — R1032 Left lower quadrant pain: Secondary | ICD-10-CM | POA: Diagnosis not present

## 2021-02-18 NOTE — ED Triage Notes (Signed)
Pt reports helping her daughter move things around her house and the next day waking up with lower back pain and pressure in groin. Initially described it as abdominal pain but then states it feels like it is a pulled muscle. Taking BC powder x 2 days with some temporary relief.

## 2021-02-19 ENCOUNTER — Emergency Department (HOSPITAL_COMMUNITY)

## 2021-02-19 LAB — CBC WITH DIFFERENTIAL/PLATELET
Abs Immature Granulocytes: 0.02 10*3/uL (ref 0.00–0.07)
Basophils Absolute: 0 10*3/uL (ref 0.0–0.1)
Basophils Relative: 0 %
Eosinophils Absolute: 0 10*3/uL (ref 0.0–0.5)
Eosinophils Relative: 1 %
HCT: 42.6 % (ref 36.0–46.0)
Hemoglobin: 14.3 g/dL (ref 12.0–15.0)
Immature Granulocytes: 0 %
Lymphocytes Relative: 26 %
Lymphs Abs: 1.8 10*3/uL (ref 0.7–4.0)
MCH: 30.8 pg (ref 26.0–34.0)
MCHC: 33.6 g/dL (ref 30.0–36.0)
MCV: 91.8 fL (ref 80.0–100.0)
Monocytes Absolute: 0.5 10*3/uL (ref 0.1–1.0)
Monocytes Relative: 8 %
Neutro Abs: 4.4 10*3/uL (ref 1.7–7.7)
Neutrophils Relative %: 65 %
Platelets: 293 10*3/uL (ref 150–400)
RBC: 4.64 MIL/uL (ref 3.87–5.11)
RDW: 12.2 % (ref 11.5–15.5)
WBC: 6.8 10*3/uL (ref 4.0–10.5)
nRBC: 0 % (ref 0.0–0.2)

## 2021-02-19 LAB — URINALYSIS, ROUTINE W REFLEX MICROSCOPIC
Bilirubin Urine: NEGATIVE
Glucose, UA: NEGATIVE mg/dL
Ketones, ur: NEGATIVE mg/dL
Leukocytes,Ua: NEGATIVE
Nitrite: POSITIVE — AB
Protein, ur: NEGATIVE mg/dL
Specific Gravity, Urine: >1.03 — ABNORMAL HIGH (ref 1.005–1.030)
pH: 5.5 (ref 5.0–8.0)

## 2021-02-19 LAB — URINALYSIS, MICROSCOPIC (REFLEX)

## 2021-02-19 LAB — HEPATIC FUNCTION PANEL
ALT: 18 U/L (ref 0–44)
AST: 16 U/L (ref 15–41)
Albumin: 3.5 g/dL (ref 3.5–5.0)
Alkaline Phosphatase: 63 U/L (ref 38–126)
Bilirubin, Direct: 0.2 mg/dL (ref 0.0–0.2)
Indirect Bilirubin: 0.5 mg/dL (ref 0.3–0.9)
Total Bilirubin: 0.7 mg/dL (ref 0.3–1.2)
Total Protein: 6.6 g/dL (ref 6.5–8.1)

## 2021-02-19 LAB — BASIC METABOLIC PANEL
Anion gap: 8 (ref 5–15)
BUN: 13 mg/dL (ref 6–20)
CO2: 26 mmol/L (ref 22–32)
Calcium: 9 mg/dL (ref 8.9–10.3)
Chloride: 104 mmol/L (ref 98–111)
Creatinine, Ser: 0.69 mg/dL (ref 0.44–1.00)
GFR, Estimated: >60 mL/min (ref >60)
Glucose, Bld: 97 mg/dL (ref 70–99)
Potassium: 3.9 mmol/L (ref 3.5–5.1)
Sodium: 138 mmol/L (ref 135–145)

## 2021-02-19 LAB — LIPASE, BLOOD: Lipase: 29 U/L (ref 11–51)

## 2021-02-19 MED ORDER — ACETAMINOPHEN 500 MG PO TABS
1000.0000 mg | ORAL_TABLET | Freq: Once | ORAL | Status: AC
  Administered 2021-02-19: 1000 mg via ORAL
  Filled 2021-02-19: qty 2

## 2021-02-19 MED ORDER — IOHEXOL 300 MG/ML  SOLN
100.0000 mL | Freq: Once | INTRAMUSCULAR | Status: AC | PRN
  Administered 2021-02-19: 100 mL via INTRAVENOUS

## 2021-02-19 MED ORDER — CEPHALEXIN 500 MG PO CAPS: 500.0000 mg | ORAL_CAPSULE | Freq: Four times a day (QID) | ORAL | 0 refills | Status: DC

## 2021-02-19 NOTE — ED Provider Notes (Signed)
[facility] EMERGENCY DEPARTMENT Provider Note   CSN: [id number] Arrival date & time: 02/18/21  1823     History Chief Complaint  Patient presents with   Abdominal Pain   Back Pain    [name] is a 50 y.o. female.  Patient presents with left lower abdominal and back pain worsening the past 2 to 3 days.  Patient denies history of kidney stones, diverticular disease or abdominal surgery except for pelvic surgery for hysterectomy.  Initially thought is musculoskeletal however worsening and severe.  Initially intermittent and constant.  No urinary symptoms.  No fevers chills or vomiting.  No cough or shortness of breath.  No injuries.      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     No family history on file.  Social History   Tobacco Use   Smoking status: Never   Smokeless tobacco: Never  Substance Use Topics   Alcohol use: Yes    Comment: Occ   Drug use: No    Home Medications Prior to Admission medications   Medication Sig Start Date End Date Taking? Authorizing Provider  HYDROcodone-acetaminophen (NORCO/VICODIN) 5-325 MG tablet Take 1-2 tablets by mouth every 6 (six) hours as needed for severe pain. 06/02/16   Bethel Born, PA-C  ibuprofen (ADVIL,MOTRIN) 600 MG tablet Take 1 tablet (600 mg total) by mouth every 6 (six) hours as needed. 03/23/15   Loren Racer, MD  methocarbamol (ROBAXIN) 500 MG tablet Take 2 tablets (1,000 mg total) by mouth every 8  (eight) hours as needed for muscle spasms. 03/23/15   Loren Racer, MD  Multiple Vitamin (MULTIVITAMIN WITH MINERALS) TABS tablet Take 1 tablet by mouth daily.    [provider]  OVER THE COUNTER MEDICATION Take 1-2 tablets by mouth daily as needed (pain). Back and body from the [employer] store    [provider]    Allergies    Patient has no known allergies.  Review of Systems   Review of Systems  Constitutional:  Negative for chills and fever.  HENT:  Negative for congestion.   Eyes:  Negative for visual disturbance.  Respiratory:  Negative for shortness of breath.   Cardiovascular:  Negative for chest pain.  Gastrointestinal:  Positive for abdominal pain. Negative for vomiting.  Genitourinary:  Positive for flank pain. Negative for dysuria.  Musculoskeletal:  Positive for back pain. Negative for neck pain and neck stiffness.  Skin:  Negative for rash.  Neurological:  Negative for light-headedness and headaches.   Physical Exam Updated Vital Signs BP 118/71   Pulse (!) 56   Temp 98.6 F (37 C) (Oral)   Resp 18   SpO2 93%   Physical Exam Vitals and nursing note reviewed.  Constitutional:      General: She is not in acute distress.    Appearance: She is well-developed.  HENT:     Head: Normocephalic and atraumatic.  Mouth/Throat:     Mouth: Mucous membranes are moist.  Eyes:     General:        Right eye: No discharge.        Left eye: No discharge.     Conjunctiva/sclera: Conjunctivae normal.  Neck:     Trachea: No tracheal deviation.  Cardiovascular:     Rate and Rhythm: Normal rate and regular rhythm.     Heart sounds: No murmur heard. Pulmonary:     Effort: Pulmonary effort is normal.     Breath sounds: Normal breath sounds.  Abdominal:     General: There is no distension.     Palpations: Abdomen is soft.     Tenderness: There is abdominal tenderness in the left lower quadrant. There is no guarding.  Musculoskeletal:     Cervical  back: Normal range of motion and neck supple. No rigidity.  Skin:    General: Skin is warm.     Capillary Refill: Capillary refill takes less than 2 seconds.     Findings: No rash.  Neurological:     General: No focal deficit present.     Mental Status: She is alert.     Cranial Nerves: No cranial nerve deficit.  Psychiatric:        Mood and Affect: Mood normal.    ED Results / Procedures / Treatments   Labs (all labs ordered are listed, but only abnormal results are displayed) Labs Reviewed  CBC WITH DIFFERENTIAL/PLATELET  BASIC METABOLIC PANEL  LIPASE, BLOOD  HEPATIC FUNCTION PANEL  URINALYSIS, ROUTINE W REFLEX MICROSCOPIC    EKG None  Radiology CT ABDOMEN PELVIS W CONTRAST  Result Date: 02/19/2021 CLINICAL DATA:  Left lower quadrant pain. Evaluate for diverticulitis. EXAM: CT ABDOMEN AND PELVIS WITH CONTRAST TECHNIQUE: Multidetector CT imaging of the abdomen and pelvis was performed using the standard protocol following bolus administration of intravenous contrast. CONTRAST:  145mL OMNIPAQUE IOHEXOL 300 MG/ML  SOLN COMPARISON:  None. FINDINGS: Lower chest: There is bibasilar atelectasis. Hepatobiliary: No focal liver abnormality is seen. No gallstones, gallbladder wall thickening, or biliary dilatation. Pancreas: Unremarkable. No pancreatic ductal dilatation or surrounding inflammatory changes. Spleen: Normal in size without focal abnormality. Adrenals/Urinary Tract: Adrenal glands are unremarkable. Kidneys are normal, without renal calculi, focal lesion, or hydronephrosis. Bladder is unremarkable. Stomach/Bowel: Stomach is within normal limits. Appendix appears normal. No evidence of bowel wall thickening, distention, or inflammatory changes. There is sigmoid colon diverticulosis without evidence for acute diverticulitis. Vascular/Lymphatic: No significant vascular findings are present. There is an enlarged left external iliac chain lymph node measuring 1.2 x 2.9 cm image 3/62.  There are some prominent left inguinal lymph nodes. Aorta is normal in size. Reproductive: Status post hysterectomy. No adnexal masses. Other: There is trace free fluid in the pelvis. There is a small fat containing umbilical hernia. 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 IMPRESSION: 1. Enlarged left external iliac chain lymph nodes, indeterminate. Recommend clinical correlation and follow-up. 2. Trace free fluid in the pelvis. 3. Sigmoid colon diverticulosis without evidence for diverticulitis. Electronically Signed   By: Ronney Asters M.D.   On: 02/19/2021 16:00    Procedures Procedures   Medications Ordered in ED Medications  acetaminophen (TYLENOL) tablet 1,000 mg (1,000 mg Oral Given 02/19/21 1244)  iohexol (OMNIPAQUE) 300 MG/ML solution 100 mL (100 mLs Intravenous Contrast Given 02/19/21 1549)    ED Course  I have reviewed the triage  vital signs and the nursing notes.  Pertinent labs & imaging results that were available during my care of the patient were reviewed by me and considered in my medical decision making (see chart for details).    MDM Rules/Calculators/A&P                           Patient presents with worsening left lower quadrant and lower flank tenderness and pain.  Discussed differential including diverticular, kidney stone, colon related, urinary related, other.  Patient wishes to hold on strong pain meds as she would like to drive home if possible.  Tylenol ordered as needed.  Blood work pending, urine and CT scan pending.  CT scan results reviewed no significant findings, mild lymphadenopathy that need patient needs outpatient follow-up for patient care signed out to follow-up urinalysis results. Blood work reviewed reassuring normal lipase, normal liver function, normal electrolytes, normal hemoglobin.  Final Clinical Impression(s) / ED Diagnoses Final  diagnoses:  Left lower quadrant abdominal pain    Rx / DC Orders ED Discharge Orders     None        Elnora Morrison, MD 02/19/21 1617

## 2021-02-19 NOTE — Discharge Instructions (Addendum)
Follow-up with your primary doctor for enlarged lymph node on your CT scan.

## 2021-02-19 NOTE — ED Provider Notes (Signed)
Patient signed to me by prior provider pending urinalysis which shows possible early infection.  Will place on antibiotics and send urine culture   Lorre Nick, MD 02/19/21 1715

## 2021-02-19 NOTE — ED Notes (Signed)
Patient transported to CT 

## 2021-02-22 LAB — URINE CULTURE: Culture: >=100000 — AB

## 2021-02-23 ENCOUNTER — Telehealth: Payer: Self-pay | Admitting: Emergency Medicine

## 2021-02-23 NOTE — Telephone Encounter (Signed)
Post ED Visit - Positive Culture Follow-up  Culture report reviewed by antimicrobial stewardship pharmacist: [facility] []  , Pharm.D. []  Enzo Bi, Pharm.D., BCPS AQ-ID []  , Pharm.D., BCPS []  Celedonio Miyamoto, Pharm.D., BCPS []  [place], Garvin Fila.D., BCPS, AAHIVP []  , Pharm.D., BCPS, AAHIVP []  Georgina Pillion, PharmD, BCPS []  , PharmD, BCPS []  [place], PharmD, BCPS [x]  [address], PharmD []  , PharmD, BCPS []  Estella Husk, PharmD  Pharmacy Team []  Lysle Pearl, PharmD []  , PharmD []  Phillips Climes, PharmD []  , Rph []  Agapito Games) , PharmD []  Delmar Landau, PharmD []  , PharmD []  Mervyn Gay, PharmD []  , PharmD []  Vinnie Level, PharmD []  [facility], PharmD []  , PharmD []  Len Childs, PharmD   Positive urine culture Treated with Cephalexin, organism sensitive to the same and no further patient follow-up is required at this time.  Terris Bodin 02/23/2021, 6:16 PM

## 2021-02-26 ENCOUNTER — Ambulatory Visit (HOSPITAL_COMMUNITY)
Admission: RE | Admit: 2021-02-26 | Discharge: 2021-02-26 | Disposition: A | Discharge status: Home / Self Care | Source: Ambulatory Visit | Attending: Family Medicine | Admitting: Family Medicine

## 2021-02-26 ENCOUNTER — Other Ambulatory Visit: Payer: Self-pay

## 2021-02-26 DIAGNOSIS — Z1231 Encounter for screening mammogram for malignant neoplasm of breast: Secondary | ICD-10-CM | POA: Insufficient documentation

## 2021-03-05 ENCOUNTER — Encounter: Payer: Self-pay | Admitting: Internal Medicine

## 2021-04-22 ENCOUNTER — Ambulatory Visit (INDEPENDENT_AMBULATORY_CARE_PROVIDER_SITE_OTHER): Payer: Self-pay | Admitting: *Deleted

## 2021-04-22 ENCOUNTER — Encounter: Payer: Self-pay | Admitting: *Deleted

## 2021-04-22 ENCOUNTER — Other Ambulatory Visit: Payer: Self-pay

## 2021-04-22 VITALS — Ht 64.0 in | Wt 250.8 lb

## 2021-04-22 DIAGNOSIS — Z1211 Encounter for screening for malignant neoplasm of colon: Secondary | ICD-10-CM

## 2021-04-22 MED ORDER — PEG 3350-KCL-NA BICARB-NACL 420 G PO SOLR: 4000.0000 mL | Freq: Once | ORAL | 0 refills | Status: AC

## 2021-04-22 NOTE — Progress Notes (Signed)
Gastroenterology Pre-Procedure Review  Request Date: 04/22/2021 Requesting Physician: Dr. Parke Simmers, no previous TCS  PATIENT REVIEW QUESTIONS: The patient responded to the following health history questions as indicated:    1. Diabetes Melitis: no 2. Joint replacements in the past 12 months: no 3. Major health problems in the past 3 months: no 4. Has an artificial valve or MVP: no 5. Has a defibrillator: no 6. Has been advised in past to take antibiotics in advance of a procedure like teeth cleaning: no 7. Family history of colon cancer: no 8. Alcohol Use: yes, 2 drinks a week 9. Illicit drug Use: no 10. History of sleep apnea: no 11. History of coronary artery or other vascular stents placed within the last 12 months: no 12. History of any prior anesthesia complications: no 13. Body mass index is 43.05 kg/m.    MEDICATIONS & ALLERGIES:    Patient reports the following regarding taking any blood thinners:   Plavix? no Aspirin? no Coumadin? no Brilinta? no Xarelto? no Eliquis? no Pradaxa? no Savaysa? no Effient? no  Patient confirms/reports the following medications:  Current Outpatient Medications  Medication Sig Dispense Refill   ibuprofen (ADVIL) 800 MG tablet Take 800 mg by mouth as needed.     meclizine (ANTIVERT) 25 MG tablet Take 25 mg by mouth as needed for dizziness.     Multiple Vitamin (MULTIVITAMIN WITH MINERALS) TABS tablet Take 1 tablet by mouth daily.     No current facility-administered medications for this visit.    Patient confirms/reports the following allergies:  No Known Allergies  No orders of the defined types were placed in this encounter.   AUTHORIZATION INFORMATION Primary Insurance: Jettie Pagan #: [id number] Pre-Cert / Berkley Harvey required: No, not required  SCHEDULE INFORMATION: Procedure has been scheduled as follows:  Date: 05/19/2021, Time: 10:30  Location: [facility] with Dr. Marletta Lor  This Gastroenterology Pre-Precedure Review Form is being routed  to the following provider(s): Ermalinda Memos, PA-C

## 2021-04-23 NOTE — Progress Notes (Signed)
Okay to schedule.  ASA 3 due to BMI. 

## 2021-04-24 NOTE — Progress Notes (Signed)
Tried to call pt  Her voicemail was full.  

## 2021-04-28 ENCOUNTER — Encounter: Payer: Self-pay | Admitting: *Deleted

## 2021-04-28 NOTE — Progress Notes (Signed)
Spoke to pt.  Scheduled procedure for 05/19/2021.  Pt aware that she will need Pre-op appointment and will be given procedure time when she goes for it.  Reviewed prep instructions with pt.  Pt aware that I will mail out prep instructions.

## 2021-04-28 NOTE — Progress Notes (Signed)
Pt aware of Pre-op appointment on 05/15/2021 at 9:00 at [facility].

## 2021-05-14 NOTE — Patient Instructions (Signed)
[name]  05/14/2021     @PREFPERIOPPHARMACY @   Your procedure is scheduled on  05/19/2021.   Report to 05/21/2021 at  0830  A.M.   Call this number if you have problems the morning of surgery:  [phone]   Remember:  Follow the diet and prep instructions given to you by the office.    Take these medicines the morning of surgery with A SIP OF WATER                                     antivert(if needed)     Do not wear jewelry, make-up or nail polish.  Do not wear lotions, powders, or perfumes, or deodorant.  Do not shave 48 hours prior to surgery.  Men may shave face and neck.  Do not bring valuables to the hospital.  [facility] is not responsible for any belongings or valuables.  Contacts, dentures or bridgework may not be worn into surgery.  Leave your suitcase in the car.  After surgery it may be brought to your room.  For patients admitted to the hospital, discharge time will be determined by your treatment team.  Patients discharged the day of surgery will not be allowed to drive home and must have someone with them for 24 hours.    Special instructions:   DO NOT smoke tobacco or vape for 24 hours before your procedure.  Please read over the following fact sheets that you were given. Anesthesia Post-op Instructions and Care and Recovery After Surgery      Colonoscopy, Adult, Care After This sheet gives you information about how to care for yourself after your procedure. Your health care provider may also give you more specific instructions. If you have problems or questions, contact your health care provider. What can I expect after the procedure? After the procedure, it is common to have: A small amount of blood in your stool for 24 hours after the procedure. Some gas. Mild cramping or bloating of your abdomen. Follow these instructions at home: Eating and drinking  Drink enough fluid to keep your urine pale yellow. Follow instructions  from your health care provider about eating or drinking restrictions. Resume your normal diet as instructed by your health care provider. Avoid heavy or fried foods that are hard to digest. Activity Rest as told by your health care provider. Avoid sitting for a long time without moving. Get up to take short walks every 1-2 hours. This is important to improve blood flow and breathing. Ask for help if you feel weak or unsteady. Return to your normal activities as told by your health care provider. Ask your health care provider what activities are safe for you. Managing cramping and bloating  Try walking around when you have cramps or feel bloated. Apply heat to your abdomen as told by your health care provider. Use the heat source that your health care provider recommends, such as a moist heat pack or a heating pad. Place a towel between your skin and the heat source. Leave the heat on for 20-30 minutes. Remove the heat if your skin turns bright red. This is especially important if you are unable to feel pain, heat, or cold. You may have a greater risk of getting burned. General instructions If you were given a sedative during the procedure, it can affect you for several hours.  Do not drive or operate machinery until your health care provider says that it is safe. For the first 24 hours after the procedure: Do not sign important documents. Do not drink alcohol. Do your regular daily activities at a slower pace than normal. Eat soft foods that are easy to digest. Take over-the-counter and prescription medicines only as told by your health care provider. Keep all follow-up visits as told by your health care provider. This is important. Contact a health care provider if: You have blood in your stool 2-3 days after the procedure. Get help right away if you have: More than a small spotting of blood in your stool. Large blood clots in your stool. Swelling of your abdomen. Nausea or vomiting. A  fever. Increasing pain in your abdomen that is not relieved with medicine. Summary After the procedure, it is common to have a small amount of blood in your stool. You may also have mild cramping and bloating of your abdomen. If you were given a sedative during the procedure, it can affect you for several hours. Do not drive or operate machinery until your health care provider says that it is safe. Get help right away if you have a lot of blood in your stool, nausea or vomiting, a fever, or increased pain in your abdomen. This information is not intended to replace advice given to you by your health care provider. Make sure you discuss any questions you have with your health care provider. Document Revised: 01/13/2019 Document Reviewed: 10/03/2018 Elsevier Patient Education  2022 [employer] Monitored Anesthesia Care, Care After This sheet gives you information about how to care for yourself after your procedure. Your health care provider may also give you more specific instructions. If you have problems or questions, contact your health care provider. What can I expect after the procedure? After the procedure, it is common to have: Tiredness. Forgetfulness about what happened after the procedure. Impaired judgment for important decisions. Nausea or vomiting. Some difficulty with balance. Follow these instructions at home: For the time period you were told by your health care provider:   Rest as needed. Do not participate in activities where you could fall or become injured. Do not drive or use machinery. Do not drink alcohol. Do not take sleeping pills or medicines that cause drowsiness. Do not make important decisions or sign legal documents. Do not take care of children on your own. Eating and drinking Follow the diet that is recommended by your health care provider. Drink enough fluid to keep your urine pale yellow. If you vomit: Drink water, juice, or soup when you can drink  without vomiting. Make sure you have little or no nausea before eating solid foods. General instructions Have a responsible adult stay with you for the time you are told. It is important to have someone help care for you until you are awake and alert. Take over-the-counter and prescription medicines only as told by your health care provider. If you have sleep apnea, surgery and certain medicines can increase your risk for breathing problems. Follow instructions from your health care provider about wearing your sleep device: Anytime you are sleeping, including during daytime naps. While taking prescription pain medicines, sleeping medicines, or medicines that make you drowsy. Avoid smoking. Keep all follow-up visits as told by your health care provider. This is important. Contact a health care provider if: You keep feeling nauseous or you keep vomiting. You feel light-headed. You are still sleepy or having trouble with balance  after 24 hours. You develop a rash. You have a fever. You have redness or swelling around the IV site. Get help right away if: You have trouble breathing. You have new-onset confusion at home. Summary For several hours after your procedure, you may feel tired. You may also be forgetful and have poor judgment. Have a responsible adult stay with you for the time you are told. It is important to have someone help care for you until you are awake and alert. Rest as told. Do not drive or operate machinery. Do not drink alcohol or take sleeping pills. Get help right away if you have trouble breathing, or if you suddenly become confused. This information is not intended to replace advice given to you by your health care provider. Make sure you discuss any questions you have with your health care provider. Document Revised: 11/23/2019 Document Reviewed: 02/09/2019 Elsevier Patient Education  2022 [employer].

## 2021-05-15 ENCOUNTER — Encounter (HOSPITAL_COMMUNITY)
Admission: RE | Admit: 2021-05-15 | Discharge: 2021-05-15 | Disposition: A | Discharge status: Home / Self Care | Source: Ambulatory Visit | Attending: Internal Medicine | Admitting: Internal Medicine

## 2021-05-18 ENCOUNTER — Encounter (HOSPITAL_COMMUNITY): Payer: Self-pay | Admitting: Anesthesiology

## 2021-05-19 ENCOUNTER — Ambulatory Visit (HOSPITAL_COMMUNITY): Admission: RE | Admit: 2021-05-19 | Source: Home / Self Care

## 2021-05-19 ENCOUNTER — Telehealth: Payer: Self-pay | Admitting: Internal Medicine

## 2021-05-19 ENCOUNTER — Encounter (HOSPITAL_COMMUNITY): Admission: RE | Payer: Self-pay | Source: Home / Self Care

## 2021-05-19 SURGERY — COLONOSCOPY WITH PROPOFOL
Anesthesia: Monitor Anesthesia Care

## 2021-05-19 NOTE — Telephone Encounter (Signed)
Pt called to reschedule her procedure that she missed today. [phone]

## 2021-05-19 NOTE — Telephone Encounter (Signed)
Spoke to pt.  Offered to reschedule her procedure.  She wanted a Tuesday.  Informed her that she would need to be done on a Monday or Thursday (ASA 3).  Informed her those are the only OR days that we have with Dr. Abbey Chatters.  Pt said that she would have to figure something out and requested to hold off.  She requested to call me back.

## 2021-05-19 NOTE — Telephone Encounter (Signed)
Noted  

## 2021-07-09 ENCOUNTER — Telehealth: Payer: Self-pay | Admitting: Internal Medicine

## 2021-07-09 NOTE — Telephone Encounter (Signed)
PATIENT CALLED TO RESCHEDULE PROCEDURE    PLEASE CALL  ?

## 2021-07-09 NOTE — Telephone Encounter (Signed)
Tried to call pt back.  Unable to leave voice mail. ?

## 2021-07-09 NOTE — Telephone Encounter (Addendum)
Pt called back today.  Triaged 04/22/2021.  Pt did not want a Mon or Thurs back then when we were scheduling her procedure.  Informed her back then that we had one doctor out (Dr. Jena Gauss).  She called in today requesting to be done on any day other than Monday.  Informed her that we had one open slot for 08/01/2021 (ASA 3 day).  She refused and said that she had a beach trip scheduled.  Pt did ask if we could do procedures after 3:30 on Mondays.  Informed her that we don't do them that late.  Informed her that I would have to call her back once June procedure schedules are available.   ?

## 2021-07-25 NOTE — Telephone Encounter (Signed)
Lmom for pt to call me back. 

## 2021-07-28 ENCOUNTER — Encounter: Payer: Self-pay | Admitting: *Deleted

## 2021-07-28 NOTE — Telephone Encounter (Signed)
Spoke to pt today.  She rescheduled procedure to 08/22/2021.  She was made aware that I would call her back with Pre-op information.  Tried to call pt back but had to leave her a detailed message with Pre-op appointment information.  Mailing out all new information to pt. ?

## 2021-07-28 NOTE — Telephone Encounter (Signed)
Pt called back and all prep instructions were reviewed by phone.  She is aware of Pre-op appointment as well.  Mailed all information to pt.  Confirmed mailing address. ?

## 2021-07-28 NOTE — Telephone Encounter (Signed)
Tried to call pt again.  Had to leave a voice mail for pt. ?

## 2021-08-19 NOTE — Patient Instructions (Signed)
[name]  08/19/2021     @PREFPERIOPPHARMACY @   Your procedure is scheduled on  08/22/2021.   Report to [facility] at  0900 A.M.   Call this number if you have problems the morning of surgery:  [phone]   Remember:  Follow the diet and prep instructions given to you by the office.    Take these medicines the morning of surgery with A SIP OF WATER                                   antivert(if needed).    Do not wear jewelry, make-up or nail polish.  Do not wear lotions, powders, or perfumes, or deodorant.  Do not shave 48 hours prior to surgery.  Men may shave face and neck.  Do not bring valuables to the hospital.  [facility] is not responsible for any belongings or valuables.  Contacts, dentures or bridgework may not be worn into surgery.  Leave your suitcase in the car.  After surgery it may be brought to your room.  For patients admitted to the hospital, discharge time will be determined by your treatment team.  Patients discharged the day of surgery will not be allowed to drive home and must have someone with them for 24 hours.    Special instructions:   DO NOT smoke tobacco or vape for 24 hours before your procedure.  Please read over the following fact sheets that you were given. Anesthesia Post-op Instructions and Care and Recovery After Surgery      Colonoscopy, Adult, Care After The following information offers guidance on how to care for yourself after your procedure. Your health care provider may also give you more specific instructions. If you have problems or questions, contact your health care provider. What can I expect after the procedure? After the procedure, it is common to have: A small amount of blood in your stool for 24 hours after the procedure. Some gas. Mild cramping or bloating of your abdomen. Follow these instructions at home: Eating and drinking  Drink enough fluid to keep your urine pale yellow. Follow instructions  from your health care provider about eating or drinking restrictions. Resume your normal diet as told by your health care provider. Avoid heavy or fried foods that are hard to digest. Activity Rest as told by your health care provider. Avoid sitting for a long time without moving. Get up to take short walks every 1-2 hours. This is important to improve blood flow and breathing. Ask for help if you feel weak or unsteady. Return to your normal activities as told by your health care provider. Ask your health care provider what activities are safe for you. Managing cramping and bloating  Try walking around when you have cramps or feel bloated. If directed, apply heat to your abdomen as told by your health care provider. Use the heat source that your health care provider recommends, such as a moist heat pack or a heating pad. Place a towel between your skin and the heat source. Leave the heat on for 20-30 minutes. Remove the heat if your skin turns bright red. This is especially important if you are unable to feel pain, heat, or cold. You have a greater risk of getting burned. General instructions If you were given a sedative during the procedure, it can affect you for several hours. Do not drive  or operate machinery until your health care provider says that it is safe. For the first 24 hours after the procedure: Do not sign important documents. Do not drink alcohol. Do your regular daily activities at a slower pace than normal. Eat soft foods that are easy to digest. Take over-the-counter and prescription medicines only as told by your health care provider. Keep all follow-up visits. This is important. Contact a health care provider if: You have blood in your stool 2-3 days after the procedure. Get help right away if: You have more than a small spotting of blood in your stool. You have large blood clots in your stool. You have swelling of your abdomen. You have nausea or vomiting. You have a  fever. You have increasing pain in your abdomen that is not relieved with medicine. These symptoms may be an emergency. Get help right away. Call 911. Do not wait to see if the symptoms will go away. Do not drive yourself to the hospital. Summary After the procedure, it is common to have a small amount of blood in your stool. You may also have mild cramping and bloating of your abdomen. If you were given a sedative during the procedure, it can affect you for several hours. Do not drive or operate machinery until your health care provider says that it is safe. Get help right away if you have a lot of blood in your stool, nausea or vomiting, a fever, or increased pain in your abdomen. This information is not intended to replace advice given to you by your health care provider. Make sure you discuss any questions you have with your health care provider. Document Revised: 10/30/2020 Document Reviewed: 10/30/2020 Elsevier Patient Education  [place] After This sheet gives you information about how to care for yourself after your procedure. Your health care provider may also give you more specific instructions. If you have problems or questions, contact your health care provider. What can I expect after the procedure? After the procedure, it is common to have: Tiredness. Forgetfulness about what happened after the procedure. Impaired judgment for important decisions. Nausea or vomiting. Some difficulty with balance. Follow these instructions at home: For the time period you were told by your health care provider:     Rest as needed. Do not participate in activities where you could fall or become injured. Do not drive or use machinery. Do not drink alcohol. Do not take sleeping pills or medicines that cause drowsiness. Do not make important decisions or sign legal documents. Do not take care of children on your own. Eating and drinking Follow the  diet that is recommended by your health care provider. Drink enough fluid to keep your urine pale yellow. If you vomit: Drink water, juice, or soup when you can drink without vomiting. Make sure you have little or no nausea before eating solid foods. General instructions Have a responsible adult stay with you for the time you are told. It is important to have someone help care for you until you are awake and alert. Take over-the-counter and prescription medicines only as told by your health care provider. If you have sleep apnea, surgery and certain medicines can increase your risk for breathing problems. Follow instructions from your health care provider about wearing your sleep device: Anytime you are sleeping, including during daytime naps. While taking prescription pain medicines, sleeping medicines, or medicines that make you drowsy. Avoid smoking. Keep all follow-up visits as told by your  health care provider. This is important. Contact a health care provider if: You keep feeling nauseous or you keep vomiting. You feel light-headed. You are still sleepy or having trouble with balance after 24 hours. You develop a rash. You have a fever. You have redness or swelling around the IV site. Get help right away if: You have trouble breathing. You have new-onset confusion at home. Summary For several hours after your procedure, you may feel tired. You may also be forgetful and have poor judgment. Have a responsible adult stay with you for the time you are told. It is important to have someone help care for you until you are awake and alert. Rest as told. Do not drive or operate machinery. Do not drink alcohol or take sleeping pills. Get help right away if you have trouble breathing, or if you suddenly become confused. This information is not intended to replace advice given to you by your health care provider. Make sure you discuss any questions you have with your health care  provider. Document Revised: 02/11/2021 Document Reviewed: 02/09/2019 Elsevier Patient Education  [place].

## 2021-08-20 ENCOUNTER — Other Ambulatory Visit: Payer: Self-pay

## 2021-08-20 ENCOUNTER — Other Ambulatory Visit (HOSPITAL_COMMUNITY)
Admission: RE | Admit: 2021-08-20 | Discharge: 2021-08-20 | Disposition: A | Discharge status: Home / Self Care | Source: Ambulatory Visit | Attending: Internal Medicine | Admitting: Internal Medicine

## 2021-08-20 ENCOUNTER — Encounter (HOSPITAL_COMMUNITY): Payer: Self-pay

## 2021-08-22 ENCOUNTER — Encounter (HOSPITAL_COMMUNITY): Payer: Self-pay

## 2021-08-22 ENCOUNTER — Ambulatory Visit (HOSPITAL_COMMUNITY): Admitting: Anesthesiology

## 2021-08-22 ENCOUNTER — Telehealth: Payer: Self-pay | Admitting: Internal Medicine

## 2021-08-22 ENCOUNTER — Encounter (HOSPITAL_COMMUNITY)
Admission: RE | Disposition: A | Discharge status: Home / Self Care | Payer: Self-pay | Source: Home / Self Care | Attending: Internal Medicine

## 2021-08-22 ENCOUNTER — Ambulatory Visit (HOSPITAL_COMMUNITY)
Admission: RE | Admit: 2021-08-22 | Discharge: 2021-08-22 | Disposition: A | Discharge status: Home / Self Care | Attending: Internal Medicine | Admitting: Internal Medicine

## 2021-08-22 ENCOUNTER — Ambulatory Visit (HOSPITAL_BASED_OUTPATIENT_CLINIC_OR_DEPARTMENT_OTHER): Admitting: Anesthesiology

## 2021-08-22 DIAGNOSIS — Z1211 Encounter for screening for malignant neoplasm of colon: Secondary | ICD-10-CM

## 2021-08-22 DIAGNOSIS — Z6841 Body Mass Index (BMI) 40.0 and over, adult: Secondary | ICD-10-CM

## 2021-08-22 DIAGNOSIS — K648 Other hemorrhoids: Secondary | ICD-10-CM | POA: Diagnosis not present

## 2021-08-22 HISTORY — PX: COLONOSCOPY WITH PROPOFOL: SHX5780

## 2021-08-22 SURGERY — COLONOSCOPY WITH PROPOFOL
Anesthesia: General

## 2021-08-22 MED ORDER — LACTATED RINGERS IV SOLN
INTRAVENOUS | Status: DC
Start: 2021-08-22 — End: 2021-08-22

## 2021-08-22 MED ORDER — LIDOCAINE HCL (CARDIAC) PF 100 MG/5ML IV SOSY
PREFILLED_SYRINGE | INTRAVENOUS | Status: DC | PRN
  Administered 2021-08-22: 50 mg via INTRAVENOUS

## 2021-08-22 MED ORDER — PROPOFOL 10 MG/ML IV BOLUS
INTRAVENOUS | Status: DC | PRN
  Administered 2021-08-22: 20 mg via INTRAVENOUS
  Administered 2021-08-22: 100 mg via INTRAVENOUS
  Administered 2021-08-22: 50 mg via INTRAVENOUS

## 2021-08-22 NOTE — Transfer of Care (Signed)
Immediate Anesthesia Transfer of Care Note  Patient: [name]  Procedure(s) Performed: COLONOSCOPY WITH PROPOFOL  Patient Location: Short Stay  Anesthesia Type:General  Level of Consciousness: awake and alert   Airway & Oxygen Therapy: Patient Spontanous Breathing  Post-op Assessment: Report given to RN and Post -op Vital signs reviewed and stable  Post vital signs: Reviewed and stable  Last Vitals:  Vitals Value Taken Time  BP    Temp    Pulse    Resp    SpO2      Last Pain:  Vitals:   08/22/21 0956  PainSc: 3          Complications: No notable events documented.

## 2021-08-22 NOTE — Anesthesia Postprocedure Evaluation (Signed)
Anesthesia Post Note  Patient: [name]  Procedure(s) Performed: COLONOSCOPY WITH PROPOFOL  Patient location during evaluation: Phase II Anesthesia Type: General Level of consciousness: awake Pain management: pain level controlled Vital Signs Assessment: post-procedure vital signs reviewed and stable Respiratory status: spontaneous breathing and respiratory function stable Cardiovascular status: blood pressure returned to baseline and stable Postop Assessment: no headache and no apparent nausea or vomiting Anesthetic complications: no Comments: Late entry   No notable events documented.   Last Vitals:  Vitals:   08/22/21 0930 08/22/21 1012  BP:  118/64  Pulse: 61 70  Resp: 15 18  Temp:  36.6 C  SpO2: 99% 100%    Last Pain:  Vitals:   08/22/21 1012  TempSrc: Oral  PainSc: 0-No pain                 Windell Norfolk

## 2021-08-22 NOTE — Telephone Encounter (Signed)
Patient not adequately prepped today for colonoscopy.  States she drank about half of the prep given.  States she did not like the taste and it made her sick.  Can we reschedule her for screening colonoscopy in 3 to 6 months with a different prep?  Thank you

## 2021-08-22 NOTE — Discharge Instructions (Signed)
  Colonoscopy Discharge Instructions  Read the instructions outlined below and refer to this sheet in the next few weeks. These discharge instructions provide you with general information on caring for yourself after you leave the hospital. Your doctor may also give you specific instructions. While your treatment has been planned according to the most current medical practices available, unavoidable complications occasionally occur.   ACTIVITY You may resume your regular activity, but move at a slower pace for the next 24 hours.  Take frequent rest periods for the next 24 hours.  Walking will help get rid of the air and reduce the bloated feeling in your belly (abdomen).  No driving for 24 hours (because of the medicine (anesthesia) used during the test).   Do not sign any important legal documents or operate any machinery for 24 hours (because of the anesthesia used during the test).  NUTRITION Drink plenty of fluids.  You may resume your normal diet as instructed by your doctor.  Begin with a light meal and progress to your normal diet. Heavy or fried foods are harder to digest and may make you feel sick to your stomach (nauseated).  Avoid alcoholic beverages for 24 hours or as instructed.  MEDICATIONS You may resume your normal medications unless your doctor tells you otherwise.  WHAT YOU CAN EXPECT TODAY Some feelings of bloating in the abdomen.  Passage of more gas than usual.  Spotting of blood in your stool or on the toilet paper.  IF YOU HAD POLYPS REMOVED DURING THE COLONOSCOPY: No aspirin products for 7 days or as instructed.  No alcohol for 7 days or as instructed.  Eat a soft diet for the next 24 hours.  FINDING OUT THE RESULTS OF YOUR TEST Not all test results are available during your visit. If your test results are not back during the visit, make an appointment with your caregiver to find out the results. Do not assume everything is normal if you have not heard from your  caregiver or the medical facility. It is important for you to follow up on all of your test results.  SEEK IMMEDIATE MEDICAL ATTENTION IF: You have more than a spotting of blood in your stool.  Your belly is swollen (abdominal distention).  You are nauseated or vomiting.  You have a temperature over 101.  You have abdominal pain or discomfort that is severe or gets worse throughout the day.   Unfortunately, your colon was not adequately prepped today for colonoscopy.  I did not see any evidence of colon cancer or large polyps, but certainly could have missed smaller polyps due to poor visualization.  Would recommend repeat colonoscopy in 3-6 months with a different colon prep.   I hope you have a great rest of your week!  Yaron Grasse K. Waylon Hershey, D.O. Gastroenterology and Hepatology [facility]  

## 2021-08-22 NOTE — Telephone Encounter (Signed)
Looks like she was a triage patient. Stacey please add to recall. thanks

## 2021-08-22 NOTE — Anesthesia Procedure Notes (Signed)
Date/Time: 08/22/2021 10:03 AM Performed by: Julian Reil, CRNA Pre-anesthesia Checklist: Patient identified, Emergency Drugs available, Suction available and Patient being monitored Patient Re-evaluated:Patient Re-evaluated prior to induction Oxygen Delivery Method: Nasal cannula Induction Type: IV induction Placement Confirmation: positive ETCO2

## 2021-08-22 NOTE — H&P (Signed)
Primary Care Physician:  Renaye Rakers, MD Primary Gastroenterologist:  Dr. Marletta Lor  Pre-Procedure History & Physical: HPI:  [name] is a 51 y.o. female is here for first ever colonoscopy for colon cancer screening purposes.  Patient denies any family history of colorectal cancer.  No melena or hematochezia.  No abdominal pain or unintentional weight loss.  No change in bowel habits.  Overall feels well from a GI standpoint.  Past Medical History:  Diagnosis Date   Vertigo     Past Surgical History:  Procedure Laterality Date   ABDOMINAL HYSTERECTOMY     TOTAL KNEE ARTHROPLASTY Left    x 2    Prior to Admission medications   Medication Sig Start Date End Date Taking? Authorizing Provider  ibuprofen (ADVIL) 800 MG tablet Take 800 mg by mouth as needed. 02/21/21   [provider]  meclizine (ANTIVERT) 25 MG tablet Take 25 mg by mouth as needed for dizziness.    [provider]  Multiple Vitamin (MULTIVITAMIN WITH MINERALS) TABS tablet Take 1 tablet by mouth daily.    [provider]    Allergies as of 07/28/2021   (No Known Allergies)    History reviewed. No pertinent family history.  Social History   Socioeconomic History   Marital status: Single    Spouse name: Not on file   Number of children: Not on file   Years of education: Not on file   Highest education level: Not on file  Occupational History   Not on file  Tobacco Use   Smoking status: Never   Smokeless tobacco: Never  Vaping Use   Vaping Use: Never used  Substance and Sexual Activity   Alcohol use: Yes    Comment: Occ   Drug use: No   Sexual activity: Not on file  Other Topics Concern   Not on file  Social History Narrative   Not on file   Social Determinants of Health   Financial Resource Strain: Not on file  Food Insecurity: Not on file  Transportation Needs: Not on file  Physical Activity: Not on file  Stress: Not on file  Social Connections: Not on file   Intimate Partner Violence: Not on file    Review of Systems: See HPI, otherwise negative ROS  Physical Exam: Vital signs in last 24 hours: Temp:  [98.5 F (36.9 C)] 98.5 F (36.9 C) (06/02 0917) Pulse Rate:  [61-70] 61 (06/02 0930) Resp:  [15-16] 15 (06/02 0930) BP: (131)/(89) 131/89 (06/02 0917) SpO2:  [98 %-99 %] 99 % (06/02 0930)   General:   Alert,  Well-developed, well-nourished, pleasant and cooperative in NAD Head:  Normocephalic and atraumatic. Eyes:  Sclera clear, no icterus.   Conjunctiva pink. Ears:  Normal auditory acuity. Nose:  No deformity, discharge,  or lesions. Mouth:  No deformity or lesions, dentition normal. Neck:  Supple; no masses or thyromegaly. Lungs:  Clear throughout to auscultation.   No wheezes, crackles, or rhonchi. No acute distress. Heart:  Regular rate and rhythm; no murmurs, clicks, rubs,  or gallops. Abdomen:  Soft, nontender and nondistended. No masses, hepatosplenomegaly or hernias noted. Normal bowel sounds, without guarding, and without rebound.   Msk:  Symmetrical without gross deformities. Normal posture. Extremities:  Without clubbing or edema. Neurologic:  Alert and  oriented x4;  grossly normal neurologically. Skin:  Intact without significant lesions or rashes. Cervical Nodes:  No significant cervical adenopathy. Psych:  Alert and cooperative. Normal mood and affect.  Impression/Plan: Jacqueline December  E Fritz is here for a colonoscopy to be performed for colon cancer screening purposes.  The risks of the procedure including infection, bleed, or perforation as well as benefits, limitations, alternatives and imponderables have been reviewed with the patient. Questions have been answered. All parties agreeable.

## 2021-08-22 NOTE — Anesthesia Preprocedure Evaluation (Signed)
Anesthesia Evaluation  Patient identified by MRN, date of birth, ID band Patient awake    Reviewed: Allergy & Precautions, H&P , NPO status , Patient's Chart, lab work & pertinent test results, reviewed documented beta blocker date and time   Airway Mallampati: II  TM Distance: >3 FB Neck ROM: full    Dental no notable dental hx.    Pulmonary neg pulmonary ROS,    Pulmonary exam normal breath sounds clear to auscultation       Cardiovascular Exercise Tolerance: Good negative cardio ROS   Rhythm:regular Rate:Normal     Neuro/Psych negative neurological ROS  negative psych ROS   GI/Hepatic negative GI ROS, Neg liver ROS,   Endo/Other  Morbid obesity  Renal/GU negative Renal ROS  negative genitourinary   Musculoskeletal   Abdominal   Peds  Hematology negative hematology ROS (+)   Anesthesia Other Findings   Reproductive/Obstetrics negative OB ROS                             Anesthesia Physical Anesthesia Plan  ASA: 3  Anesthesia Plan: General   Post-op Pain Management:    Induction:   PONV Risk Score and Plan: Propofol infusion  Airway Management Planned:   Additional Equipment:   Intra-op Plan:   Post-operative Plan:   Informed Consent: I have reviewed the patients History and Physical, chart, labs and discussed the procedure including the risks, benefits and alternatives for the proposed anesthesia with the patient or authorized representative who has indicated his/her understanding and acceptance.     Dental Advisory Given  Plan Discussed with: CRNA  Anesthesia Plan Comments:         Anesthesia Quick Evaluation  

## 2021-08-22 NOTE — Op Note (Signed)
[facility] Patient Name: [name] Procedure Date: 08/22/2021 9:46 AM MRN: [id number] Date of Birth: 1971/01/24 Attending MD: Elon Alas. Abbey Chatters DO CSN: [id number] Age: 51 Admit Type: Inpatient Procedure:                Colonoscopy Indications:              Screening for colorectal malignant neoplasm Providers:                Elon Alas. Abbey Chatters, DO, Caprice Kluver, Rosina Lowenstein, RN Referring MD:              Medicines:                See the Anesthesia note for documentation of the                            administered medications Complications:            No immediate complications. Estimated Blood Loss:     Estimated blood loss: none. Procedure:                Pre-Anesthesia Assessment:                           - The anesthesia plan was to use monitored                            anesthesia care (MAC).                           After obtaining informed consent, the colonoscope                            was passed under direct vision. Throughout the                            procedure, the patient's blood pressure, pulse, and                            oxygen saturations were monitored continuously. The                            [id number] ([id number]) was introduced through the                            anus and advanced to the the cecum, identified by                            appendiceal orifice and ileocecal valve. The                            colonoscopy was performed without difficulty. The                            patient tolerated the procedure well. The quality                            of the  bowel preparation was evaluated using the                            BBPS [facility] Bowel Preparation Scale) with scores                            of: Right Colon = 1 (portion of mucosa seen, but                            other areas not well seen due to staining, residual                            stool and/or opaque liquid), Transverse Colon = 2                             (minor amount of residual staining, small fragments                            of stool and/or opaque liquid, but mucosa seen                            well) and Left Colon = 2 (minor amount of residual                            staining, small fragments of stool and/or opaque                            liquid, but mucosa seen well). The total BBPS score                            equals 5. The quality of the bowel preparation was                            inadequate. Scope In: 10:00:28 AM Scope Out: 79:39:03 AM Scope Withdrawal Time: 0 hours 3 minutes 22 seconds  Total Procedure Duration: 0 hours 5 minutes 40 seconds  Findings:      The perianal and digital rectal examinations were normal.      Non-bleeding internal hemorrhoids were found during endoscopy.      A large amount of solid stool was found in the transverse colon, in the       ascending colon and in the cecum, precluding visualization. Lavage of       the area was performed using copious amounts of sterile water, resulting       in incomplete clearance with continued poor visualization. Impression:               - Preparation of the colon was inadequate.                           - Non-bleeding internal hemorrhoids.                           - Stool in the transverse colon, in the ascending  colon and in the cecum.                           - No specimens collected. Moderate Sedation:      Per Anesthesia Care Recommendation:           - Patient has a contact number available for                            emergencies. The signs and symptoms of potential                            delayed complications were discussed with the                            patient. Return to normal activities tomorrow.                            Written discharge instructions were provided to the                            patient.                           - Continue present medications.                            - Resume previous diet.                           - Repeat colonoscopy in 3-6 months because the                            bowel preparation was poor.                           - Return to GI office PRN. Procedure Code(s):        --- Professional ---                           O0355, Colorectal cancer screening; colonoscopy on                            individual not meeting criteria for high risk Diagnosis Code(s):        --- Professional ---                           Z12.11, Encounter for screening for malignant                            neoplasm of colon                           K64.8, Other hemorrhoids CPT copyright 2019 American Medical Association. All rights reserved. The codes documented in this report are preliminary and upon coder review may  be revised to meet current compliance requirements. Elon Alas. Abbey Chatters, DO Juanda Crumble  Arlee Muslim, DO 08/22/2021 10:09:13 AM This report has been signed electronically. Number of Addenda: 0

## 2021-09-02 ENCOUNTER — Encounter (HOSPITAL_COMMUNITY): Payer: Self-pay | Admitting: Internal Medicine

## 2021-12-28 IMAGING — MG MM DIGITAL SCREENING BILAT W/ TOMO AND CAD
8 series · 8 of 24 positions shown · non-contrast
Comparison: Previous exam(s).

CLINICAL DATA: Screening.

EXAM:
DIGITAL SCREENING BILATERAL MAMMOGRAM WITH TOMOSYNTHESIS AND CAD
TECHNIQUE: Bilateral screening digital craniocaudal and mediolateral oblique
mammograms were obtained. Bilateral screening digital breast
tomosynthesis was performed. The images were evaluated with
computer-aided detection.

[R MLO synth-2D]
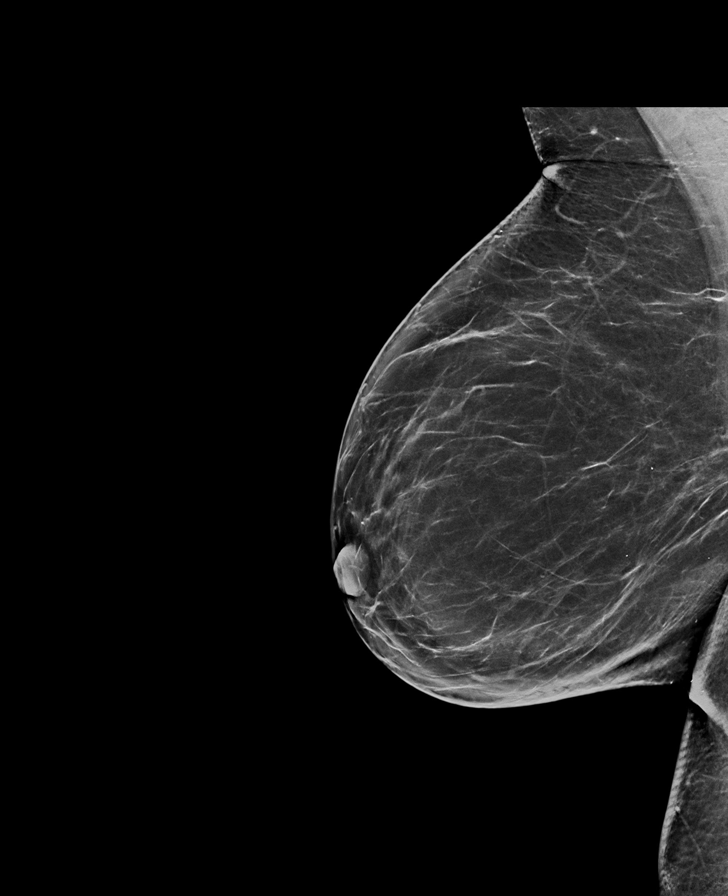

[L MLO synth-2D]
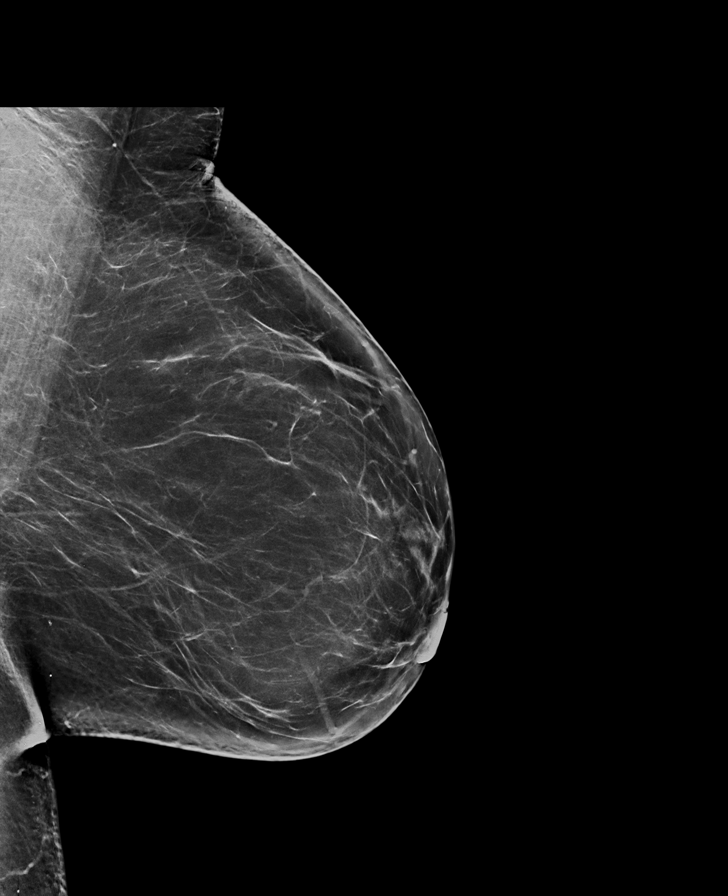

[L CC synth-2D]
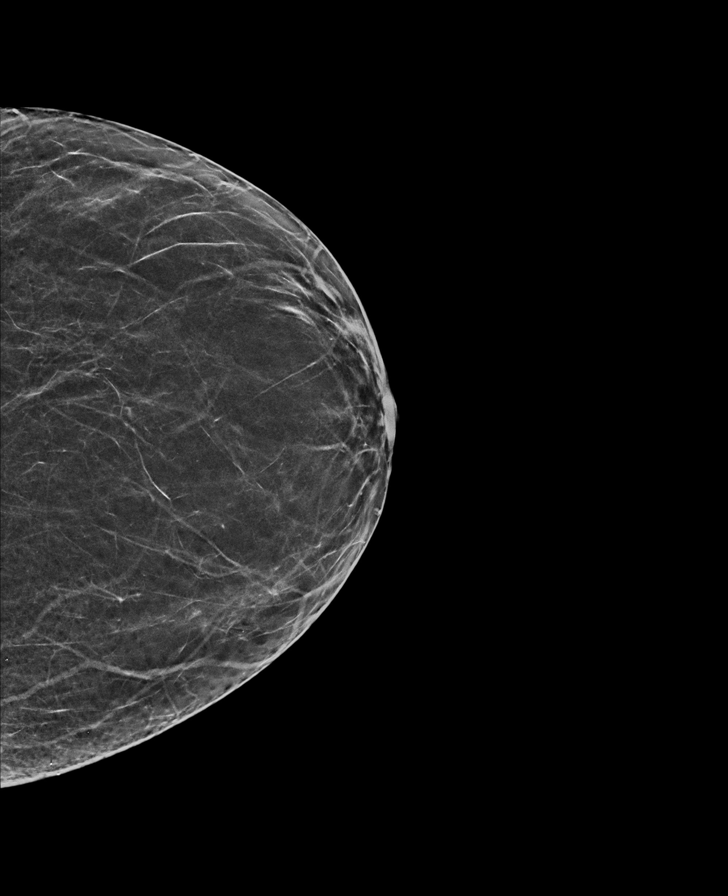

[R CC synth-2D]
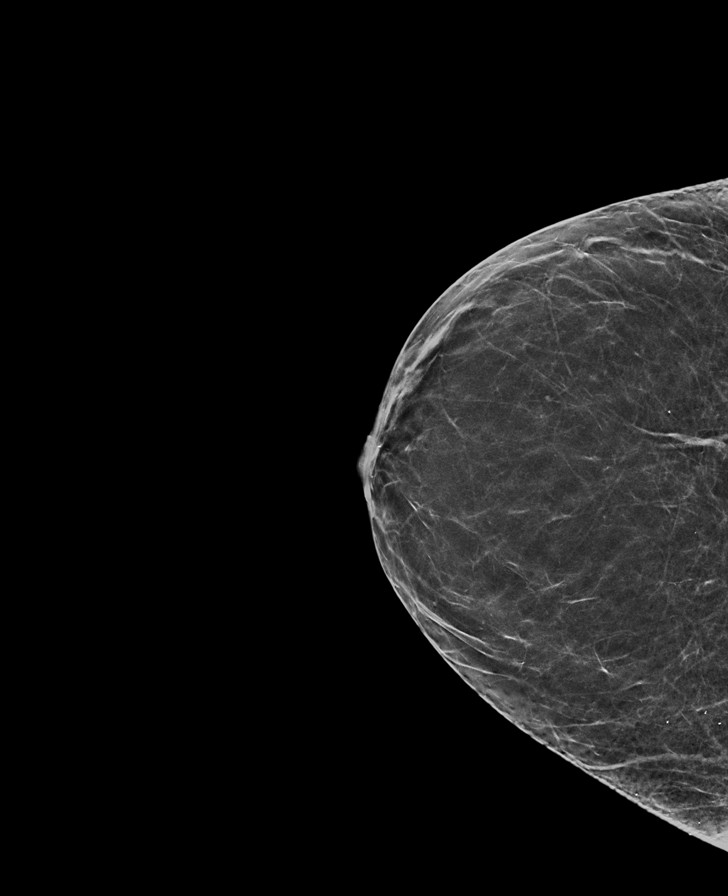

[R MLO tomo · tomo slice 39/77.0]
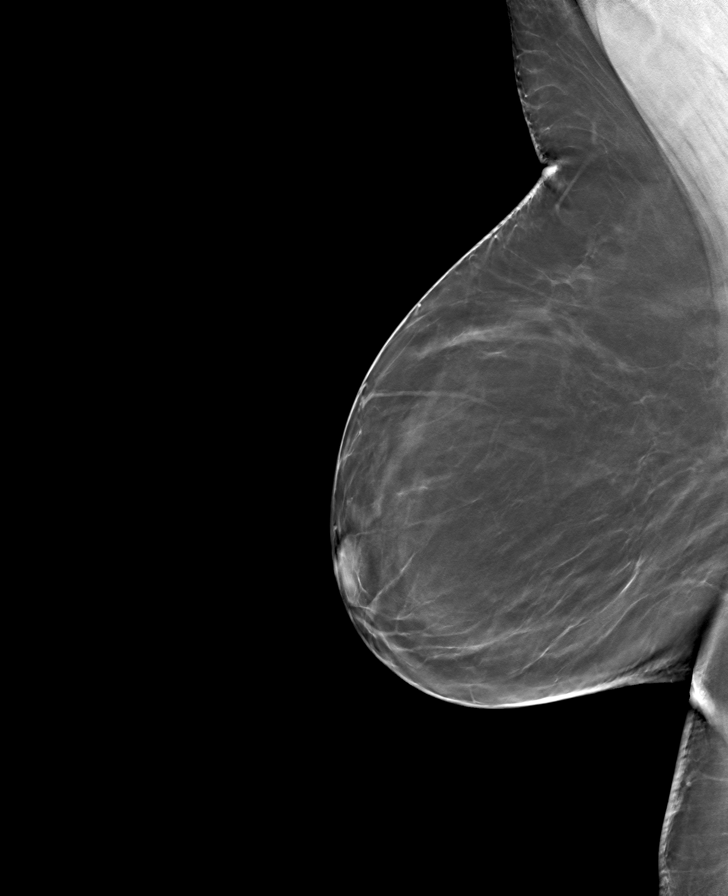

[R CC tomo · tomo slice 30/59.0]
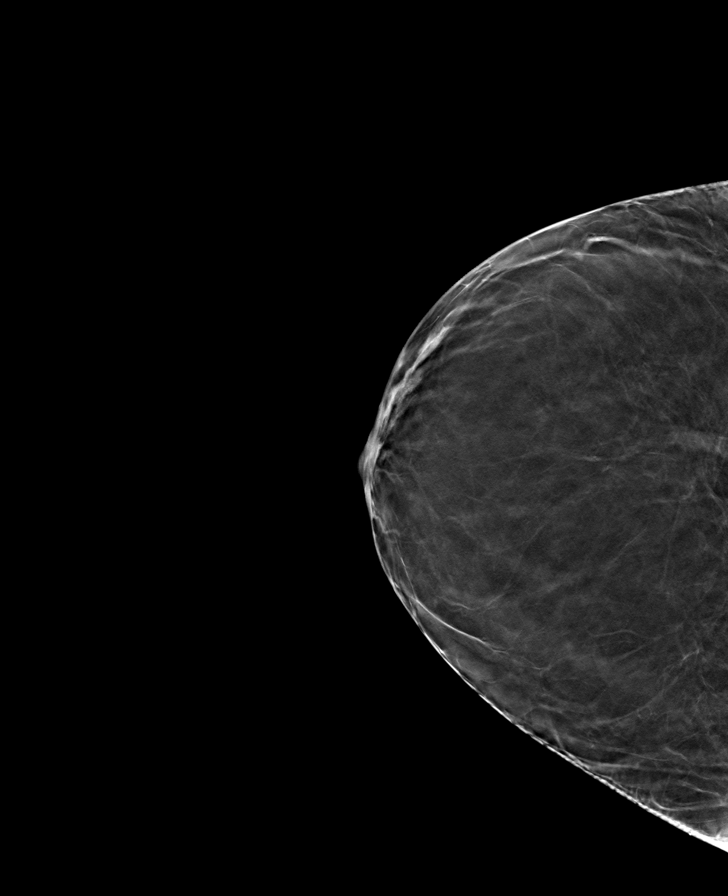

[L CC tomo · tomo slice 31/60.0]
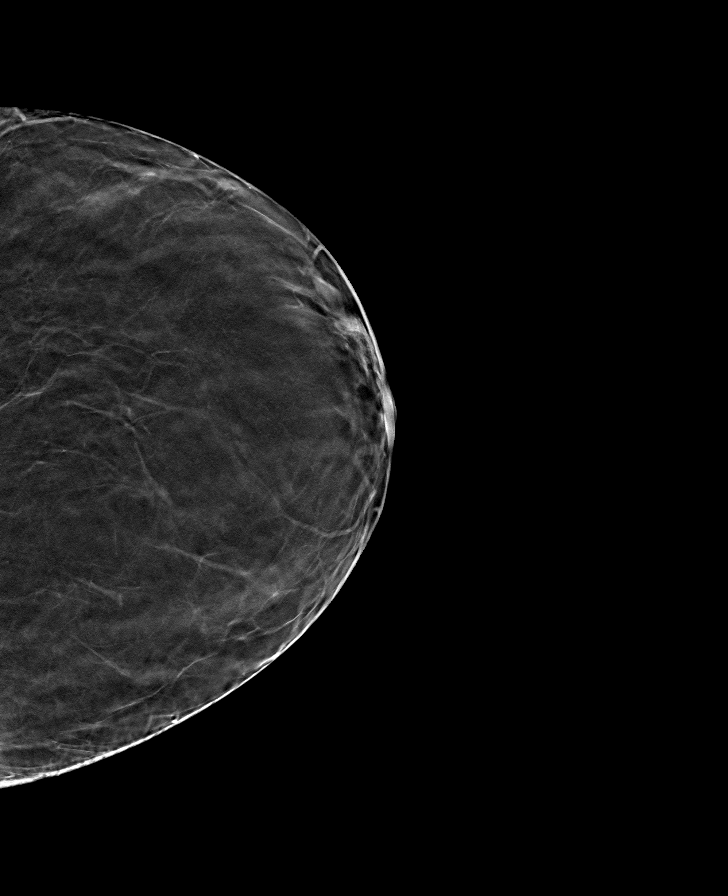

[L MLO tomo · tomo slice 41/80.0]
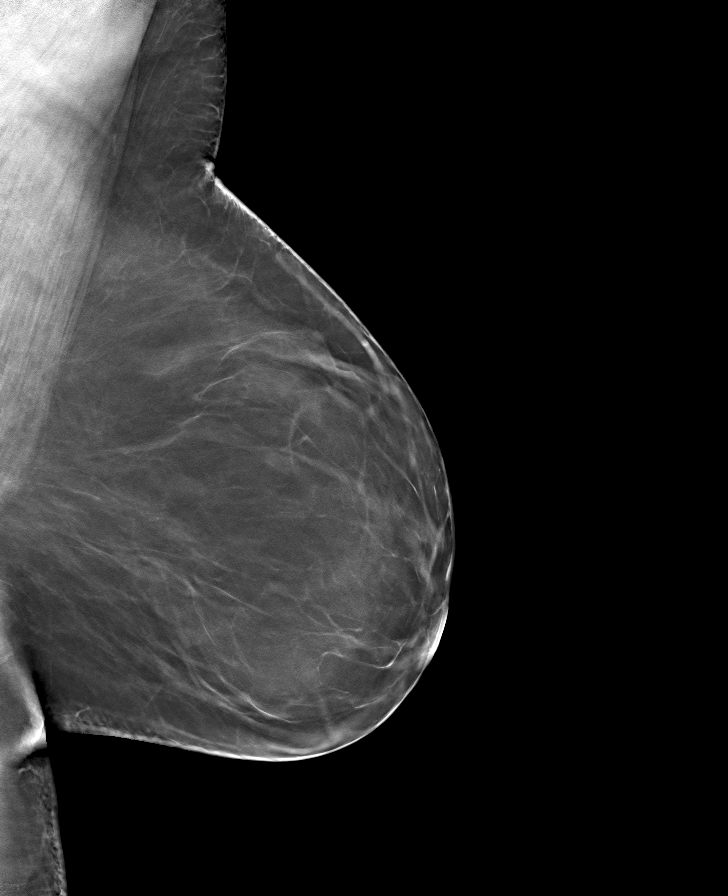

[8 of 24 positions shown; findings below may reference images not displayed]

ACR Breast Density Category b: There are scattered areas of
fibroglandular density.
FINDINGS: There are no findings suspicious for malignancy.
IMPRESSION: No mammographic evidence of malignancy. A result letter of this
screening mammogram will be mailed directly to the patient.

RECOMMENDATION:
Screening mammogram in one year. (Code:51-O-LD2)

BI-RADS CATEGORY  1: Negative.

## 2022-02-06 ENCOUNTER — Encounter: Payer: Self-pay | Admitting: *Deleted

## 2022-05-01 ENCOUNTER — Other Ambulatory Visit (HOSPITAL_COMMUNITY): Payer: Self-pay | Admitting: Family Medicine

## 2022-05-01 DIAGNOSIS — Z1231 Encounter for screening mammogram for malignant neoplasm of breast: Secondary | ICD-10-CM

## 2022-05-13 ENCOUNTER — Ambulatory Visit (HOSPITAL_COMMUNITY)

## 2022-06-02 ENCOUNTER — Other Ambulatory Visit (HOSPITAL_COMMUNITY): Payer: Self-pay | Admitting: Family Medicine

## 2022-06-02 DIAGNOSIS — R109 Unspecified abdominal pain: Secondary | ICD-10-CM

## 2022-06-02 DIAGNOSIS — Z8719 Personal history of other diseases of the digestive system: Secondary | ICD-10-CM

## 2022-06-18 ENCOUNTER — Encounter (HOSPITAL_COMMUNITY): Payer: Self-pay

## 2022-06-18 ENCOUNTER — Ambulatory Visit (HOSPITAL_COMMUNITY)
Admission: RE | Admit: 2022-06-18 | Discharge: 2022-06-18 | Disposition: A | Discharge status: Home / Self Care | Source: Ambulatory Visit | Attending: Family Medicine | Admitting: Family Medicine

## 2022-06-18 DIAGNOSIS — Z8719 Personal history of other diseases of the digestive system: Secondary | ICD-10-CM

## 2022-06-18 DIAGNOSIS — R109 Unspecified abdominal pain: Secondary | ICD-10-CM | POA: Diagnosis not present

## 2022-06-18 MED ORDER — IOHEXOL 9 MG/ML PO SOLN
1000.0000 mL | ORAL | Status: AC
  Administered 2022-06-18: 1000 mL via ORAL

## 2022-06-18 MED ORDER — IOHEXOL 300 MG/ML  SOLN
100.0000 mL | Freq: Once | INTRAMUSCULAR | Status: AC | PRN
  Administered 2022-06-18: 100 mL via INTRAVENOUS

## 2022-06-18 MED ORDER — SODIUM CHLORIDE (PF) 0.9 % IJ SOLN
INTRAMUSCULAR | Status: AC
  Filled 2022-06-18: qty 50

## 2022-06-22 ENCOUNTER — Ambulatory Visit (HOSPITAL_COMMUNITY)

## 2022-06-22 ENCOUNTER — Ambulatory Visit (HOSPITAL_COMMUNITY)
Admission: RE | Admit: 2022-06-22 | Discharge: 2022-06-22 | Disposition: A | Discharge status: Home / Self Care | Source: Ambulatory Visit | Attending: Family Medicine | Admitting: Family Medicine

## 2022-06-22 ENCOUNTER — Encounter (HOSPITAL_COMMUNITY): Payer: Self-pay

## 2022-06-22 DIAGNOSIS — Z1231 Encounter for screening mammogram for malignant neoplasm of breast: Secondary | ICD-10-CM | POA: Insufficient documentation

## 2022-06-29 ENCOUNTER — Other Ambulatory Visit: Payer: Self-pay | Admitting: Family Medicine

## 2022-06-29 ENCOUNTER — Other Ambulatory Visit (HOSPITAL_COMMUNITY)
Admission: RE | Admit: 2022-06-29 | Discharge: 2022-06-29 | Disposition: A | Discharge status: Home / Self Care | Source: Ambulatory Visit | Attending: Family Medicine | Admitting: Family Medicine

## 2022-06-29 DIAGNOSIS — Z01419 Encounter for gynecological examination (general) (routine) without abnormal findings: Secondary | ICD-10-CM | POA: Diagnosis present

## 2022-07-02 LAB — CYTOLOGY - PAP
Adequacy: ABSENT
Chlamydia: NEGATIVE
Comment: NEGATIVE
Comment: NEGATIVE
Comment: NEGATIVE
Comment: NEGATIVE
Comment: NORMAL
Diagnosis: NEGATIVE
HSV1: NEGATIVE
HSV2: NEGATIVE
High risk HPV: NEGATIVE
Neisseria Gonorrhea: NEGATIVE
Trichomonas: NEGATIVE

## 2022-07-04 ENCOUNTER — Encounter (HOSPITAL_COMMUNITY): Payer: Self-pay

## 2022-07-04 ENCOUNTER — Emergency Department (HOSPITAL_COMMUNITY)
Admission: EM | Admit: 2022-07-04 | Discharge: 2022-07-04 | Disposition: A | Discharge status: Home / Self Care | Attending: Emergency Medicine | Admitting: Emergency Medicine

## 2022-07-04 DIAGNOSIS — M25512 Pain in left shoulder: Secondary | ICD-10-CM | POA: Insufficient documentation

## 2022-07-04 DIAGNOSIS — M542 Cervicalgia: Secondary | ICD-10-CM | POA: Insufficient documentation

## 2022-07-04 DIAGNOSIS — M25511 Pain in right shoulder: Secondary | ICD-10-CM | POA: Insufficient documentation

## 2022-07-04 DIAGNOSIS — M7918 Myalgia, other site: Secondary | ICD-10-CM | POA: Diagnosis not present

## 2022-07-04 DIAGNOSIS — M545 Low back pain, unspecified: Secondary | ICD-10-CM | POA: Diagnosis not present

## 2022-07-04 DIAGNOSIS — Y92410 Unspecified street and highway as the place of occurrence of the external cause: Secondary | ICD-10-CM | POA: Insufficient documentation

## 2022-07-04 MED ORDER — METHOCARBAMOL 500 MG PO TABS: 1000.0000 mg | ORAL_TABLET | Freq: Four times a day (QID) | ORAL | 0 refills | Status: DC

## 2022-07-04 MED ORDER — NAPROXEN 500 MG PO TABS: 500.0000 mg | ORAL_TABLET | Freq: Two times a day (BID) | ORAL | 0 refills | Status: DC
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
## 2023-06-14 ENCOUNTER — Ambulatory Visit (HOSPITAL_COMMUNITY)
Admission: EM | Admit: 2023-06-14 | Discharge: 2023-06-14 | Disposition: A | Discharge status: Home / Self Care | Attending: Internal Medicine | Admitting: Internal Medicine

## 2023-06-14 ENCOUNTER — Encounter (HOSPITAL_COMMUNITY): Payer: Self-pay

## 2023-06-14 DIAGNOSIS — S61214A Laceration without foreign body of right ring finger without damage to nail, initial encounter: Secondary | ICD-10-CM

## 2023-06-14 DIAGNOSIS — Z23 Encounter for immunization: Secondary | ICD-10-CM

## 2023-06-14 MED ORDER — BUPIVACAINE HCL (PF) 0.5 % IJ SOLN
INTRAMUSCULAR | Status: AC
  Filled 2023-06-14: qty 10

## 2023-06-14 MED ORDER — TETANUS-DIPHTH-ACELL PERTUSSIS 5-2.5-18.5 LF-MCG/0.5 IM SUSY
PREFILLED_SYRINGE | INTRAMUSCULAR | Status: AC
  Filled 2023-06-14: qty 0.5

## 2023-06-14 MED ORDER — TETANUS-DIPHTH-ACELL PERTUSSIS 5-2.5-18.5 LF-MCG/0.5 IM SUSY
0.5000 mL | PREFILLED_SYRINGE | Freq: Once | INTRAMUSCULAR | Status: AC
  Administered 2023-06-14: 0.5 mL via INTRAMUSCULAR

## 2023-06-14 NOTE — ED Triage Notes (Signed)
 Patient here today with c/o laceration to right ring finger this morning while washing dishes. Patient states that a glass broke. Last tetanus unknown.

## 2023-06-14 NOTE — Discharge Instructions (Signed)

## 2023-06-14 NOTE — ED Provider Notes (Signed)
 [facility]    CSN: [id number] Arrival date & time: 06/14/23  1219      History   Chief Complaint Chief Complaint  Patient presents with  . Laceration    HPI [name] is a 53 y.o. female.   [name] is a 53 y.o. female presenting for chief complaint of Laceration. Laceration is to the distal lateral portion of the right ring finger and happened this morning while she was washing dishes. She accidentally cut the finger on a knife in the sink. No damage to  the nail. Denies numbness/tingling distally to laceration. Bleeding well controlled. She does not take blood thinners. She cannot remember the date of her last tetanus injection.     Past Medical History:  Diagnosis Date  . Vertigo     Patient Active Problem List   Diagnosis Date Noted  . Anticoagulated 06/17/2012  . Morbid obesity with BMI of 40.0-44.9, adult (HCC) 06/17/2012  . Acute postoperative pain of extremity 05/31/2012  . 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 KNEE SPRAIN 09/16/2009    Past Surgical History:  Procedure Laterality Date  . ABDOMINAL HYSTERECTOMY    . COLONOSCOPY WITH PROPOFOL N/A 08/22/2021   Procedure: COLONOSCOPY WITH PROPOFOL;  Surgeon: Lanelle Bal, DO;  Location: [facility];  Service: Endoscopy;  Laterality: N/A;  11:00 / ASA 3  . TOTAL KNEE ARTHROPLASTY Left    x 2    OB History   No obstetric history on file.      Home Medications    Prior to Admission medications   Medication Sig Start Date End Date Taking? Authorizing Provider  ibuprofen (ADVIL) 800 MG tablet Take 800 mg by mouth every 8 (eight) hours as needed.    [provider]  meclizine (ANTIVERT) 25 MG tablet Take 25 mg by mouth as needed for dizziness.    [provider]  methocarbamol (ROBAXIN) 500 MG tablet Take 2 tablets (1,000 mg total) by mouth 4 (four) times daily. 07/04/22   Renne Crigler, PA-C  Multiple Vitamin  (MULTIVITAMIN WITH MINERALS) TABS tablet Take 1 tablet by mouth daily.    [provider]  naproxen (NAPROSYN) 500 MG tablet Take 1 tablet (500 mg total) by mouth 2 (two) times daily. 07/04/22   Renne Crigler, PA-C    Family History History reviewed. No pertinent family history.  Social History Social History   Tobacco Use  . Smoking status: Never  . Smokeless tobacco: Never  Vaping Use  . Vaping status: Never Used  Substance Use Topics  . Alcohol use: Yes    Comment: Occ  . Drug use: No     Allergies   Patient has no known allergies.   Review of Systems Review of Systems Per HPI  Physical Exam Triage Vital Signs ED Triage Vitals [06/14/23 1302]  Encounter Vitals Group     BP      Systolic BP Percentile      Diastolic BP Percentile      Pulse      Resp      Temp      Temp src      SpO2      Weight      Height      Head Circumference      Peak Flow      Pain Score 7     Pain Loc      Pain Education  Exclude from Growth Chart    No data found.  Updated Vital Signs There were no vitals taken for this visit.  Visual Acuity Right Eye Distance:   Left Eye Distance:   Bilateral Distance:    Right Eye Near:   Left Eye Near:    Bilateral Near:     Physical Exam Vitals and nursing note reviewed.  Constitutional:      Appearance: She is not ill-appearing or toxic-appearing.  HENT:     Head: Normocephalic and atraumatic.     Right Ear: Hearing and external ear normal.     Left Ear: Hearing and external ear normal.     Nose: Nose normal.     Mouth/Throat:     Lips: Pink.  Eyes:     General: Lids are normal. Vision grossly intact. Gaze aligned appropriately.     Extraocular Movements: Extraocular movements intact.     Conjunctiva/sclera: Conjunctivae normal.  Pulmonary:     Effort: Pulmonary effort is normal.  Musculoskeletal:     Cervical back: Neck supple.  Skin:    General: Skin is warm and dry.     Capillary Refill: Capillary  refill takes less than 2 seconds.     Findings: Laceration (V shaped laceration to the lateral portion of the right ring finger as seen in image below.) present. No rash.     Comments: Sensation and strength distally to laceration intact. Full ROM of affected digit. Less than 2 cap refill. +2 right radial pulse.   Neurological:     General: No focal deficit present.     Mental Status: She is alert and oriented to person, place, and time. Mental status is at baseline.     Cranial Nerves: No dysarthria or facial asymmetry.  Psychiatric:        Mood and Affect: Mood normal.        Speech: Speech normal.        Behavior: Behavior normal.        Thought Content: Thought content normal.        Judgment: Judgment normal.   V shaped laceration to the lateral portion of the right ring finger   UC Treatments / Results  Labs (all labs ordered are listed, but only abnormal results are displayed) Labs Reviewed - No data to display  EKG   Radiology No results found.  Procedures Procedures (including critical care time)  Medications Ordered in UC Medications  Tdap (BOOSTRIX) injection 0.5 mL (0.5 mLs Intramuscular Given 06/14/23 1316)    Initial Impression / Assessment and Plan / UC Course  I have reviewed the triage vital signs and the nursing notes.  Pertinent labs & imaging results that were available during my care of the patient were reviewed by me and considered in my medical decision making (see chart for details).     *** Final Clinical Impressions(s) / UC Diagnoses   Final diagnoses:  None   Discharge Instructions   None    ED Prescriptions   None    PDMP not reviewed this encounter.

## 2023-06-27 ENCOUNTER — Ambulatory Visit (HOSPITAL_COMMUNITY)
Admission: EM | Admit: 2023-06-27 | Discharge: 2023-06-27 | Disposition: A | Discharge status: Home / Self Care | Attending: Internal Medicine | Admitting: Internal Medicine

## 2023-06-27 VITALS — BP 130/91 | HR 83 | Temp 98.1°F | Resp 20

## 2023-06-27 DIAGNOSIS — T148XXA Other injury of unspecified body region, initial encounter: Secondary | ICD-10-CM | POA: Diagnosis not present

## 2023-06-27 DIAGNOSIS — Z4802 Encounter for removal of sutures: Secondary | ICD-10-CM

## 2023-06-27 DIAGNOSIS — L089 Local infection of the skin and subcutaneous tissue, unspecified: Secondary | ICD-10-CM

## 2023-06-27 MED ORDER — PENTAFLUOROPROP-TETRAFLUOROETH EX AERO
INHALATION_SPRAY | CUTANEOUS | Status: AC
  Filled 2023-06-27: qty 30

## 2023-06-27 MED ORDER — DOXYCYCLINE HYCLATE 100 MG PO CAPS: 100.0000 mg | ORAL_CAPSULE | Freq: Two times a day (BID) | ORAL | 0 refills | Status: AC

## 2023-06-27 NOTE — ED Notes (Addendum)
 The patient is having a lot of pain with attempt to remove sutures,  skin around sutures is pulling against sutures and pallor to skin immediately around sutures and laceration.    Notified Erin Raspet, PA of the above

## 2023-06-27 NOTE — ED Notes (Signed)
 No sutures removed by this nurse.  Provider staff removed sutures

## 2023-06-27 NOTE — ED Triage Notes (Addendum)
 Patient is in department for suture removal.  Sutures placed on 3/24 at this location.  Provider note reports 5 sutures.

## 2023-06-27 NOTE — Discharge Instructions (Addendum)
 Sutures removed today.  Possibly mild infectious process.  Will treat with the following: Doxycycline 100 mg twice daily for 3 days Wash the area with soap and water daily.  May leave open to air Return to urgent care or PCP if symptoms worsen or fail to resolve.

## 2023-06-27 NOTE — ED Provider Notes (Signed)
 [facility]    CSN: [id number] Arrival date & time: 06/27/23  1144      History   Chief Complaint Chief Complaint  Patient presents with   Appointment    12:00   Suture / Staple Removal    HPI [name] is a 53 y.o. female.   53 year old female who presents urgent care needing sutures removed from her right ring finger.  These were placed on 3/24.  She has noted some increased pain and swelling in the last day or 2.  She denies any fevers or chills.  5 sutures are removed.  There is some mild swelling and redness which could be suture reaction versus low-grade infection.  Will cover with doxycycline 100 mg twice daily for 3 days.   Suture / Staple Removal Pertinent negatives include no chest pain, no abdominal pain and no shortness of breath.    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      Home Medications    Prior to Admission medications   Medication Sig Start Date End Date Taking? Authorizing Provider  doxycycline (VIBRAMYCIN) 100 MG capsule Take 1 capsule (100 mg total) by mouth 2 (two) times daily for 3 days. 06/27/23 06/30/23 Yes Meilech Virts A, PA-C  ibuprofen (ADVIL) 800 MG tablet Take 800 mg by mouth every 8 (eight) hours as needed.    [provider]  meclizine  (ANTIVERT) 25 MG tablet Take 25 mg by mouth as needed for dizziness.    [provider]  methocarbamol (ROBAXIN) 500 MG tablet Take 2 tablets (1,000 mg total) by mouth 4 (four) times daily. 07/04/22   Renne Crigler, PA-C  Multiple Vitamin (MULTIVITAMIN WITH MINERALS) TABS tablet Take 1 tablet by mouth daily.    [provider]  naproxen (NAPROSYN) 500 MG tablet Take 1 tablet (500 mg total) by mouth 2 (two) times daily. 07/04/22   Renne Crigler, PA-C    Family History No family history on file.  Social History Social History   Tobacco Use   Smoking status: Never   Smokeless tobacco: Never  Vaping Use   Vaping status: Never Used  Substance Use Topics   Alcohol use: Yes    Comment: Occ   Drug use: No     Allergies   Patient has no known allergies.   Review of Systems Review of Systems  Constitutional:  Negative for chills and fever.  HENT:  Negative for ear pain and sore throat.   Eyes:  Negative for pain and visual disturbance.  Respiratory:  Negative for cough and shortness of breath.   Cardiovascular:  Negative for chest pain and palpitations.  Gastrointestinal:  Negative for abdominal pain and vomiting.  Genitourinary:  Negative for dysuria and hematuria.  Musculoskeletal:  Negative for arthralgias and back pain.  Skin:  Negative for color change and rash.       Sutures present right ring finger  Neurological:  Negative for seizures and syncope.  All other systems reviewed and are negative.    Physical Exam Triage Vital Signs ED Triage Vitals [06/27/23 1226]  Encounter Vitals Group     BP (!) 130/91     Systolic BP Percentile      Diastolic BP Percentile      Pulse Rate 83     Resp 20     Temp 98.1 F (36.7 C)     Temp Source Oral     SpO2 96 %     Weight      Height      Head Circumference      Peak Flow      Pain Score      Pain Loc      Pain Education      Exclude from Growth Chart    No data found.  Updated Vital Signs BP  (!) 130/91 (BP Location: Left Arm)   Pulse 83   Temp 98.1 F (36.7 C) (Oral)   Resp 20   SpO2 96%   Visual Acuity Right Eye Distance:   Left Eye Distance:   Bilateral Distance:    Right Eye Near:   Left Eye Near:    Bilateral Near:     Physical Exam Vitals and nursing note reviewed.  Constitutional:      General: She is not in acute distress.    Appearance: She is well-developed.  HENT:     Head: Normocephalic and atraumatic.  Cardiovascular:     Rate and Rhythm: Normal rate.  Pulmonary:     Effort: Pulmonary effort is normal. No respiratory distress.  Musculoskeletal:        General: No swelling.     Cervical back: Neck supple.  Skin:    General: Skin is warm and dry.     Capillary Refill: Capillary refill takes less than 2 seconds.     Comments: 5 sutures removed from the right ring finger, mild swelling and erythema  Neurological:     Mental Status: She is alert.  Psychiatric:        Mood and Affect: Mood normal.      UC Treatments / Results  Labs (all labs ordered are listed, but only abnormal results are displayed) Labs Reviewed - No data to display  EKG   Radiology No results found.  Procedures Procedures (including critical care time)  Medications Ordered in UC Medications - No data to display  Initial Impression / Assessment and Plan / UC Course  I have reviewed the triage vital signs and the nursing notes.  Pertinent labs & imaging results that were available during my care of the patient were reviewed by me and considered in my medical decision making (see chart for details).     Visit for suture removal  Wound infection   Sutures removed today.  Possibly mild infectious process.  Will treat with the following: Doxycycline 100 mg twice daily for 3 days Wash the area with soap and water daily.  May leave open to air Return to urgent care or PCP if symptoms worsen or fail to resolve.    Final Clinical Impressions(s) / UC Diagnoses    Final diagnoses:  Visit for suture removal  Wound infection     Discharge Instructions  Sutures removed today.  Possibly mild infectious process.  Will treat with the following: Doxycycline 100 mg twice daily for 3 days Wash the area with soap and water daily.  May leave open to air Return to urgent care or PCP if symptoms worsen or fail to resolve.     ED Prescriptions     Medication Sig Dispense Auth. Provider   doxycycline (VIBRAMYCIN) 100 MG capsule Take 1 capsule (100 mg total) by mouth 2 (two) times daily for 3 days. 6 capsule Landis Martins, [place]      PDMP not reviewed this encounter.   Landis Martins, [place] 06/27/23 1258

## 2023-08-17 ENCOUNTER — Other Ambulatory Visit (HOSPITAL_COMMUNITY): Payer: Self-pay | Admitting: Transplant

## 2023-08-17 ENCOUNTER — Other Ambulatory Visit (HOSPITAL_COMMUNITY): Payer: Self-pay | Admitting: Physician Assistant

## 2023-08-17 DIAGNOSIS — Z1231 Encounter for screening mammogram for malignant neoplasm of breast: Secondary | ICD-10-CM

## 2023-08-23 ENCOUNTER — Ambulatory Visit (HOSPITAL_COMMUNITY)

## 2023-08-30 ENCOUNTER — Encounter (HOSPITAL_COMMUNITY): Payer: Self-pay

## 2023-08-30 ENCOUNTER — Ambulatory Visit (HOSPITAL_COMMUNITY)
Admission: RE | Admit: 2023-08-30 | Discharge: 2023-08-30 | Disposition: A | Discharge status: Home / Self Care | Source: Ambulatory Visit | Attending: Physician Assistant | Admitting: Physician Assistant

## 2023-08-30 DIAGNOSIS — Z1231 Encounter for screening mammogram for malignant neoplasm of breast: Secondary | ICD-10-CM | POA: Diagnosis present

## 2023-09-11 ENCOUNTER — Emergency Department (HOSPITAL_COMMUNITY)
Admission: EM | Admit: 2023-09-11 | Discharge: 2023-09-11 | Disposition: A | Discharge status: Home / Self Care | Attending: Emergency Medicine | Admitting: Emergency Medicine

## 2023-09-11 ENCOUNTER — Other Ambulatory Visit: Payer: Self-pay

## 2023-09-11 ENCOUNTER — Encounter (HOSPITAL_COMMUNITY): Payer: Self-pay

## 2023-09-11 ENCOUNTER — Emergency Department (HOSPITAL_COMMUNITY)

## 2023-09-11 DIAGNOSIS — J069 Acute upper respiratory infection, unspecified: Secondary | ICD-10-CM | POA: Insufficient documentation

## 2023-09-11 DIAGNOSIS — R059 Cough, unspecified: Secondary | ICD-10-CM | POA: Diagnosis present

## 2023-09-11 MED ORDER — AMOXICILLIN 500 MG PO CAPS: 500.0000 mg | ORAL_CAPSULE | Freq: Three times a day (TID) | ORAL | 0 refills | Status: DC

## 2023-09-11 MED ORDER — CETIRIZINE HCL 10 MG PO TABS: 10.0000 mg | ORAL_TABLET | Freq: Every day | ORAL | 1 refills | Status: AC

## 2023-09-11 MED ORDER — FLUTICASONE PROPIONATE 50 MCG/ACT NA SUSP: 2.0000 | Freq: Every day | NASAL | 1 refills | Status: AC

## 2023-09-11 NOTE — ED Provider Notes (Signed)
 [facility]ERGENC[facility] DEPARTMENT AT [facility] Provider Note   CSN: [id number] Arrival date & time: 09/11/23  1043     Patient presents with: Cough   [name] is a 53 y.o. female who presented to the emergency department today due to persistent cough over the last 3 weeks to 1 month.  She is concerned that she has pneumonia or upper respiratory infection.  Denies having any fever or dyspnea however does states she has pain in her chest specifically with forceful coughing.  She has had a persistent cough with occasional productive of sputum that is largely clear and thin.  Denies having any previous pulmonary etiologies or diagnoses.  Prior to arrival today she is not taking any medications for fever or any other medications.    Cough Associated symptoms: no chills, no fever, no shortness of breath and no wheezing        Prior to Admission medications   Medication Sig Start Date End Date Taking? Authorizing Provider  amoxicillin (AMOXIL) 500 MG capsule Take 1 capsule (500 mg total) by mouth 3 (three) times daily. 09/11/23  Yes Myriam Dorn BROCKS, PA  cetirizine (ZYRTEC ALLERGY) 10 MG tablet Take 1 tablet (10 mg total) by mouth daily. 09/11/23  Yes Myriam Dorn BROCKS, PA  fluticasone (FLONASE) 50 MCG/ACT nasal spray Place 2 sprays into both nostrils daily. 09/11/23  Yes Myriam Dorn BROCKS, PA  ibuprofen  (ADVIL ) 800 MG tablet Take 800 mg by mouth every 8 (eight) hours as needed.    [provider]  meclizine (ANTIVERT) 25 MG tablet Take 25 mg by mouth as needed for dizziness.    [provider]  methocarbamol  (ROBAXIN ) 500 MG tablet Take 2 tablets (1,000 mg total) by mouth 4 (four) times daily. 07/04/22   Geiple, Joshua, PA-C  Multiple Vitamin (MULTIVITAMIN WITH MINERALS) TABS tablet Take 1 tablet by mouth daily.    [provider]  naproxen  (NAPROSYN ) 500 MG tablet Take 1 tablet (500 mg total) by mouth 2 (two) times daily. 07/04/22   Geiple,  Joshua, PA-C    Allergies: Patient has no known allergies.    Review of Systems  Constitutional:  Positive for fatigue. Negative for appetite change, chills and fever.  Respiratory:  Positive for cough. Negative for shortness of breath, wheezing and stridor.   All other systems reviewed and are negative.   Updated Vital Signs BP (!) 155/97 (BP Location: Right Arm)   Pulse 85   Temp 98.6 F (37 C)   Resp 17   Ht 5' 4 (1.626 m)   Wt 115.7 kg   SpO2 97%   BMI 43.77 kg/m   Physical Exam Vitals and nursing note reviewed.  Constitutional:      General: She is not in acute distress.    Appearance: Normal appearance.  HENT:     Head: Normocephalic and atraumatic.     Nose: Septal deviation, congestion and rhinorrhea present. Rhinorrhea is clear.     Right Nostril: No occlusion.     Left Nostril: No occlusion.     Right Turbinates: Enlarged and swollen.     Left Turbinates: Enlarged and swollen.     Right Sinus: Maxillary sinus tenderness present. No frontal sinus tenderness.     Left Sinus: Maxillary sinus tenderness present. No frontal sinus tenderness.     Comments: Right-sided septal deviation with enlargements of bilateral inferior turbinates, bilateral nares are patent    Mouth/Throat:     Mouth: Mucous membranes are moist.  Pharynx: Oropharynx is clear.   Eyes:     Extraocular Movements: Extraocular movements intact.     Conjunctiva/sclera: Conjunctivae normal.     Pupils: Pupils are equal, round, and reactive to light.    Cardiovascular:     Rate and Rhythm: Normal rate and regular rhythm.     Pulses: Normal pulses.     Heart sounds: Normal heart sounds. No murmur heard.    No friction rub. No gallop.  Pulmonary:     Effort: Pulmonary effort is normal.     Breath sounds: Normal breath sounds and air entry.  Abdominal:     General: Abdomen is flat. Bowel sounds are normal.     Palpations: Abdomen is soft.   Musculoskeletal:        General: Normal range  of motion.     Cervical back: Normal range of motion and neck supple.     Right lower leg: No edema.     Left lower leg: No edema.   Skin:    General: Skin is warm and dry.     Capillary Refill: Capillary refill takes less than 2 seconds.   Neurological:     General: No focal deficit present.     Mental Status: She is alert. Mental status is at baseline.   Psychiatric:        Mood and Affect: Mood normal.     (all labs ordered are listed, but only abnormal results are displayed) Labs Reviewed - No data to display  EKG: None  Radiology: DG Chest 2 View Result Date: 09/11/2023 CLINICAL DATA:  3 week history of cough. EXAM: CHEST - 2 VIEW COMPARISON:  03/23/2015 FINDINGS: Heart size appears normal. No pleural fluid, interstitial edema, or airspace disease. Thoracic spondylosis noted. IMPRESSION: No acute cardiopulmonary disease. Electronically Signed   By: Waddell Calk M.D.   On: 09/11/2023 11:19     Procedures   Medications Ordered in the ED - No data to display                                  Medical Decision Making Risk OTC drugs. Prescription drug management.   Medical Decision Making:   [name] is a 53 y.o. female who presented to the ED today with cough and nasal congestion detailed above.     Complete initial physical exam performed, notably the patient  was alert and oriented in no apparent distress.  There is a notable right-sided septal deviation with enlargement and engorgement of the inferior turbinates bilaterally.     Reviewed and confirmed nursing documentation for past medical history, family history, social history.    Initial Assessment:   With the patient's presentation of cough and congestion, most likely diagnosis is sinusitis/upper respiratory infection. Other diagnoses were considered including (but not limited to) pneumonia. These are considered less likely due to history of present illness and physical exam findings.     Initial  Plan:   CXR to evaluate for structural/infectious intrathoracic pathology.  Respiratory panel deferred as symptoms have been present over the last month Objective evaluation as below reviewed   Initial Study Results:    Radiology:  All images reviewed independently. Agree with radiology report at this time.   DG Chest 2 View Result Date: 09/11/2023 CLINICAL DATA:  3 week history of cough. EXAM: CHEST - 2 VIEW COMPARISON:  03/23/2015 FINDINGS: Heart size appears normal. No pleural fluid, interstitial  edema, or airspace disease. Thoracic spondylosis noted. IMPRESSION: No acute cardiopulmonary disease. Electronically Signed   By: Waddell Calk M.D.   On: 09/11/2023 11:19   MM 3D SCREENING MAMMOGRAM BILATERAL BREAST Result Date: 09/02/2023 CLINICAL DATA:  Screening. EXAM: DIGITAL SCREENING BILATERAL MAMMOGRAM WITH TOMOSYNTHESIS AND CAD TECHNIQUE: Bilateral screening digital craniocaudal and mediolateral oblique mammograms were obtained. Bilateral screening digital breast tomosynthesis was performed. The images were evaluated with computer-aided detection. COMPARISON:  Previous exam(s). ACR Breast Density Category a: The breasts are almost entirely fatty. FINDINGS: There are no findings suspicious for malignancy. IMPRESSION: No mammographic evidence of malignancy. A result letter of this screening mammogram will be mailed directly to the patient. RECOMMENDATION: Screening mammogram in one year. (Code:[id number]-01Y) BI-RADS CATEGORY  1: Negative. Electronically Signed   By: Alm Parkins M.D.   On: 09/02/2023 09:25    Reassessment and Plan:   Chest x-ray does not show any acute pulmonary disease, lungs are clear and equal bilaterally, no suspicion for pneumonia or other pulmonary disorder at this time.  Based on clinical assessment believe this patient has upper respiratory infection and likely sinusitis.  Will manage for same with outpatient course of amoxicillin, also provide antihistamine therapy with  cetirizine, and provide intranasal corticosteroids.       Final diagnoses:  Upper respiratory tract infection, unspecified type    ED Discharge Orders          Ordered    amoxicillin (AMOXIL) 500 MG capsule  3 times daily        09/11/23 1204    fluticasone (FLONASE) 50 MCG/ACT nasal spray  Daily        09/11/23 1204    cetirizine (ZYRTEC ALLERGY) 10 MG tablet  Daily        09/11/23 1204               Myriam Dorn BROCKS, [place] 09/11/23 1238    Melvenia Motto, MD 09/11/23 2114

## 2023-09-11 NOTE — ED Triage Notes (Signed)
 Complains of cough x 3 weeks.  Reports her family has been passing it around and her friend came to visit and had PNA.  Denies fever.  Denies SOB.  Complains of pain in chest wall when she coughs.

## 2023-10-18 ENCOUNTER — Emergency Department (HOSPITAL_COMMUNITY): Admission: EM | Admit: 2023-10-18 | Discharge: 2023-10-18 | Disposition: A | Discharge status: Home / Self Care

## 2023-10-18 ENCOUNTER — Encounter (HOSPITAL_COMMUNITY): Payer: Self-pay

## 2023-10-18 ENCOUNTER — Other Ambulatory Visit: Payer: Self-pay

## 2023-10-18 ENCOUNTER — Emergency Department (HOSPITAL_COMMUNITY)

## 2023-10-18 DIAGNOSIS — R059 Cough, unspecified: Secondary | ICD-10-CM | POA: Insufficient documentation

## 2023-10-18 DIAGNOSIS — R0789 Other chest pain: Secondary | ICD-10-CM | POA: Diagnosis present

## 2023-10-18 DIAGNOSIS — R079 Chest pain, unspecified: Secondary | ICD-10-CM

## 2023-10-18 LAB — COMPREHENSIVE METABOLIC PANEL WITH GFR
ALT: 16 U/L (ref 0–44)
AST: 16 U/L (ref 15–41)
Albumin: 3.2 g/dL — ABNORMAL LOW (ref 3.5–5.0)
Alkaline Phosphatase: 68 U/L (ref 38–126)
Anion gap: 5 (ref 5–15)
BUN: 17 mg/dL (ref 6–20)
CO2: 27 mmol/L (ref 22–32)
Calcium: 9.1 mg/dL (ref 8.9–10.3)
Chloride: 105 mmol/L (ref 98–111)
Creatinine, Ser: 0.67 mg/dL (ref 0.44–1.00)
GFR, Estimated: >60 mL/min (ref >60)
Glucose, Bld: 112 mg/dL — ABNORMAL HIGH (ref 70–99)
Potassium: 4.4 mmol/L (ref 3.5–5.1)
Sodium: 137 mmol/L (ref 135–145)
Total Bilirubin: 0.5 mg/dL (ref 0.0–1.2)
Total Protein: 6.5 g/dL (ref 6.5–8.1)

## 2023-10-18 LAB — CBC WITH DIFFERENTIAL/PLATELET
Abs Immature Granulocytes: 0.02 K/uL (ref 0.00–0.07)
Basophils Absolute: 0 K/uL (ref 0.0–0.1)
Basophils Relative: 0 %
Eosinophils Absolute: 0.1 K/uL (ref 0.0–0.5)
Eosinophils Relative: 1 %
HCT: 41.2 % (ref 36.0–46.0)
Hemoglobin: 14 g/dL (ref 12.0–15.0)
Immature Granulocytes: 0 %
Lymphocytes Relative: 28 %
Lymphs Abs: 1.6 K/uL (ref 0.7–4.0)
MCH: 30.6 pg (ref 26.0–34.0)
MCHC: 34 g/dL (ref 30.0–36.0)
MCV: 90.2 fL (ref 80.0–100.0)
Monocytes Absolute: 0.4 K/uL (ref 0.1–1.0)
Monocytes Relative: 8 %
Neutro Abs: 3.6 K/uL (ref 1.7–7.7)
Neutrophils Relative %: 63 %
Platelets: 298 K/uL (ref 150–400)
RBC: 4.57 MIL/uL (ref 3.87–5.11)
RDW: 12.4 % (ref 11.5–15.5)
WBC: 5.7 K/uL (ref 4.0–10.5)
nRBC: 0 % (ref 0.0–0.2)

## 2023-10-18 LAB — D-DIMER, QUANTITATIVE: D-Dimer, Quant: 0.36 ug{FEU}/mL (ref 0.00–0.50)

## 2023-10-18 LAB — LIPASE, BLOOD: Lipase: 30 U/L (ref 11–51)

## 2023-10-18 LAB — TROPONIN I (HIGH SENSITIVITY)
Troponin I (High Sensitivity): 3 ng/L (ref <18)
Troponin I (High Sensitivity): <2 ng/L (ref <18)

## 2023-10-18 NOTE — ED Notes (Signed)
 Asked Phlebotomy to stick for labs.

## 2023-10-18 NOTE — ED Triage Notes (Signed)
 Pt arrives POV c/o centralized chest pain and bil shoulder pain that started about a week ago. Does endorse a cough but denies other sx.

## 2023-10-18 NOTE — ED Notes (Addendum)
 Pt refused IV placement  I dont want to get an IV unless there is an immediate need. Education provided to pt, pt refused. Phlebotomy asked to stick.

## 2023-10-18 NOTE — ED Provider Notes (Signed)
 [place] EMERGENCY DEPARTMENT AT [facility] Provider Note   CSN: [id number] Arrival date & time: 10/18/23  9152     Patient presents with: Chest Pain   [name] is a 53 y.o. female.   This is a 53 year old female presenting emergency department for chest pain.  Center of chest.  [name].  Had some radiation to her right shoulder yesterday, now to the left shoulder today.  Sharp pain with movement.  Better with rest.  No associated nausea or vomiting.  Reports minor cough, but no shortness of breath.  No nausea vomiting diarrhea.  No history of heart problems   Chest Pain      Prior to Admission medications   Medication Sig Start Date End Date Taking? Authorizing Provider  cetirizine  (ZYRTEC  ALLERGY) 10 MG tablet Take 1 tablet (10 mg total) by mouth daily. Patient taking differently: Take 10 mg by mouth daily as needed for allergies. 09/11/23  Yes Myriam Dorn BROCKS, PA  fluticasone  (FLONASE ) 50 MCG/ACT nasal spray Place 2 sprays into both nostrils daily. Patient taking differently: Place 2 sprays into both nostrils daily as needed for allergies. 09/11/23  Yes Myriam Dorn BROCKS, PA  ibuprofen  (ADVIL ) 800 MG tablet Take 800 mg by mouth every 8 (eight) hours as needed (pain).   Yes [provider]  Multiple Vitamin (MULTIVITAMIN WITH MINERALS) TABS tablet Take 1 tablet by mouth daily.   Yes [provider]    Allergies: Patient has no known allergies.    Review of Systems  Cardiovascular:  Positive for chest pain.    Updated Vital Signs BP 121/66   Pulse 60   Temp 98 F (36.7 C) (Oral)   Resp 16   Ht 5' 4 (1.626 m)   Wt 113.4 kg   SpO2 99%   BMI 42.91 kg/m   Physical Exam Vitals and nursing note reviewed.  Constitutional:      General: She is not in acute distress.    Appearance: She is not ill-appearing.  Cardiovascular:     Rate and Rhythm: Normal rate and regular rhythm.     Pulses: Normal pulses.     Heart sounds: Normal  heart sounds.  Pulmonary:     Effort: Pulmonary effort is normal. No respiratory distress.     Breath sounds: Normal breath sounds. No wheezing, rhonchi or rales.  Chest:     Chest wall: Tenderness present.  Abdominal:     General: Abdomen is flat. There is no distension.     Palpations: Abdomen is soft. There is no mass.     Tenderness: There is no abdominal tenderness. There is no guarding or rebound.  Musculoskeletal:        General: Normal range of motion.     Right lower leg: No edema.     Left lower leg: No edema.  Skin:    General: Skin is warm and dry.     Capillary Refill: Capillary refill takes less than 2 seconds.  Neurological:     General: No focal deficit present.     Mental Status: She is alert.  Psychiatric:        Mood and Affect: Mood normal.        Behavior: Behavior normal.     (all labs ordered are listed, but only abnormal results are displayed) Labs Reviewed  COMPREHENSIVE METABOLIC PANEL WITH GFR - Abnormal; Notable for the following components:      Result Value   Glucose, Bld 112 (*)  Albumin 3.2 (*)    All other components within normal limits  CBC WITH DIFFERENTIAL/PLATELET  LIPASE, BLOOD  D-DIMER, QUANTITATIVE  TROPONIN I (HIGH SENSITIVITY)  TROPONIN I (HIGH SENSITIVITY)    EKG: EKG Interpretation Date/Time:  Monday October 18 2023 08:55:53 EDT Ventricular Rate:  63 PR Interval:  181 QRS Duration:  81 QT Interval:  417 QTC Calculation: 427 R Axis:   11  Text Interpretation: Sinus rhythm Confirmed by Neysa Clap [phone]) on 10/18/2023 1:56:48 PM  Radiology: [facility] Chest 2 View Result Date: 10/18/2023 CLINICAL DATA:  Chest pain. EXAM: CHEST - 2 VIEW COMPARISON:  09/11/2023 FINDINGS: Normal sized heart. Clear lungs with normal vascularity. Moderate thoracic spine degenerative changes. IMPRESSION: No acute abnormality. Electronically Signed   By: Elspeth Bathe M.D.   On: 10/18/2023 10:21     Procedures   Medications Ordered in the ED - No  data to display  Clinical Course as of 10/18/23 1641  Mon Oct 18, 2023  1058 D-Dimer, Quant: 0.36 Unlikely to have PE as she is otherwise low risk based on Wells criteria [TY]  1406 Troponin I (High Sensitivity): <2 Negative x 2.  ACS unlikely. [TY]  1406 CBC with Differential No leukocytosis or anemia that would explain her symptoms [TY]  1406 Comprehensive metabolic panel(!) No electrolyte abnormalities.  Normal kidney function.  No transaminitis to suggest PAD biliary disease. [TY]  1406 Lipase: 30 Pancreatitis unlikely [TY]  1406 DG Chest 2 View No pneumonia or pneumothorax on my independent interpretation.  Radiology agrees, no acute findings [TY]  1406 Patient workup reassuring.  I do suspect MSK etiology.  Discussed supportive care.  However, she does have some cardiac risk factors and discussed further outpatient testing.  Will refer to cardiology.  Return precautions given.  Stable for discharge. [TY]    Clinical Course User Index [TY] Neysa Clap PARAS, DO                                 Medical Decision Making Is a well-appearing 53 year old female presenting emergency department for chest pain x 1 week, has been constant but has varied in intensity.  Worse overnight last night.  Description sounds musculoskeletal with sharp pain with movement of her upper extremities that is better with rest.  However, does have some age and risk factors for possible cardiac etiology.  Will get basic labs/troponin/chest x-ray.  Her EKG on my independent interpretation was normal sinus rhythm at 63 bpm.  No ST segment changes to indicate ischemia.  Normal intervals.  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  With her chest pain, cough will get D-dimer to exclude PE as she is otherwise low risk.  See ED course for further MDM and final disposition.    Amount and/or Complexity of Data Reviewed Labs: ordered. Decision-making details documented in ED Course. Radiology: ordered.  Decision-making details documented in ED Course.       Final diagnoses:  Nonspecific chest pain    ED Discharge Orders          Ordered    Ambulatory referral to Cardiology       Comments: If you have not heard from the Cardiology office within the next 72 hours please call [phone].   10/18/23 1408               Neysa Clap PARAS, DO 10/18/23 1641

## 2023-10-18 NOTE — ED Notes (Signed)
 Paper work reviewed with pt. No questions or concerns pt is leaving for lobby to drive self home. Pt is in no new onset distress at this time and says she is ready to leave for home.

## 2023-10-18 NOTE — Discharge Instructions (Addendum)
 Please follow-up with your primary doctor.  You may take over-the-counter medication such as Tylenol  or ibuprofen  for your pain.  Return if felt fevers, chills, sudden onset headache, chest pain, shortness of breath, abdominal pain or any new or worsening symptoms that are concerning to you.  I have urged you to cardiology, they should call to schedule an appointment.  If you do not hear from them in the next 48 hours, please call to schedule an appointment.

## 2023-10-19 ENCOUNTER — Encounter: Payer: Self-pay | Admitting: Cardiology

## 2023-10-19 ENCOUNTER — Ambulatory Visit: Attending: Cardiology | Admitting: Cardiology

## 2023-10-19 VITALS — BP 124/76 | HR 60 | Resp 16 | Ht 64.0 in | Wt 259.0 lb

## 2023-10-19 DIAGNOSIS — Z6841 Body Mass Index (BMI) 40.0 and over, adult: Secondary | ICD-10-CM | POA: Diagnosis not present

## 2023-10-19 DIAGNOSIS — R0789 Other chest pain: Secondary | ICD-10-CM | POA: Diagnosis not present

## 2023-10-19 NOTE — Progress Notes (Signed)
 Cardiology Office Note:  .   Date:  10/19/2023  ID:  [name], DOB 1971/03/12, MRN [id number] PCP: [name]  [facility] Providers Cardiologist:  Gordy Bergamo, MD   History of Present Illness: .   [name] is a 53 y.o. 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  She now presents to establish cardiac care.  Denies shortness of breath, PND or orthopnea, leg edema.  No family history of sudden cardiac death or premature coronary disease.  Discussed the use of AI scribe software for clinical note transcription with the patient, who gave verbal consent to proceed.  History of Present Illness [name] is a 53 year old female who presents with chest discomfort.  She was referred by the emergency room for follow-up after a recent visit for chest pain.  She has experienced chest discomfort for approximately two weeks, described as a pressure-like sensation. Initially, the pain lasted for about five days, subsided, and then returned last Friday, becoming continuous. The discomfort sometimes radiates to her arms and shoulders. An EKG and enzyme tests were performed during her emergency room visit on October 18, 2023.  She does not smoke and reports no shortness of breath with walking. She acknowledges overeating but does not consume a lot of fried foods.  She reports a new onset of neck pain over the past few days, which she describes as different from her chest pain.  Labs    Lab Results  Component Value Date   NA 137 10/18/2023   K 4.4 10/18/2023   CO2 27 10/18/2023   GLUCOSE 112 (H) 10/18/2023   BUN 17 10/18/2023   CREATININE 0.67 10/18/2023   CALCIUM 9.1 10/18/2023   GFRNONAA >60 10/18/2023      Latest Ref Rng &  Units 10/18/2023   10:05 AM 02/19/2021   12:33 PM 03/23/2015   12:56 PM  BMP  Glucose 70 - 99 mg/dL 887  97  94   BUN 6 - 20 mg/dL 17  13  11    Creatinine 0.44 - 1.00 mg/dL 9.32  9.30  9.25   Sodium 135 - 145 mmol/L 137  138  141   Potassium 3.5 - 5.1 mmol/L 4.4  3.9  4.3   Chloride 98 - 111 mmol/L 105  104  103   CO2 22 - 32 mmol/L 27  26  31    Calcium 8.9 - 10.3 mg/dL 9.1  9.0  9.3       Latest Ref Rng & Units 10/18/2023   10:05 AM 02/19/2021   12:33 PM 03/23/2015   12:56 PM  CBC  WBC 4.0 - 10.5 K/uL 5.7  6.8  6.6   Hemoglobin 12.0 - 15.0 g/dL 85.9  85.6  86.1   Hematocrit 36.0 - 46.0 % 41.2  42.6  40.0   Platelets 150 - 400 K/uL 298  293  314     External Labs:  [facility] labs 05/29/2022:  Total cholesterol 131, triglycerides 42, HDL 67, LDL 52.  ROS  Review of Systems  Cardiovascular:  Positive for chest pain. Negative for dyspnea on exertion and leg swelling.   Physical Exam:   VS:  BP 124/76 (BP Location: Left Arm, Patient Position: Sitting, Cuff Size: Large)   Pulse 60  Resp 16   Ht 5' 4 (1.626 m)   Wt 259 lb (117.5 kg)   SpO2 96%   BMI 44.46 kg/m    Wt Readings from Last 3 Encounters:  10/19/23 259 lb (117.5 kg)  10/18/23 250 lb (113.4 kg)  09/11/23 255 lb (115.7 kg)    Physical Exam Constitutional:      Appearance: She is morbidly obese.  Neck:     Vascular: No carotid bruit or JVD.  Cardiovascular:     Rate and Rhythm: Normal rate and regular rhythm.     Pulses: Intact distal pulses.     Heart sounds: Normal heart sounds. No murmur heard.    No gallop.  Pulmonary:     Effort: Pulmonary effort is normal.     Breath sounds: Normal breath sounds.  Chest:  Breasts:    Right: Tenderness (right parasternal tenderness) present.  Abdominal:     General: Bowel sounds are normal.     Palpations: Abdomen is soft.  Musculoskeletal:     Right lower leg: No edema.     Left lower leg: No edema.    Studies Reviewed: SABRA    CXR from ED : Normal EKG:     EKG Interpretation Date/Time:  Tuesday October 19 2023 09:40:38 EDT Ventricular Rate:  62 PR Interval:  184 QRS Duration:  86 QT Interval:  428 QTC Calculation: 434 R Axis:   4  Text Interpretation: EKG 10/19/2023: Normal sinus rhythm with sinus rhythm at rate of 62 bpm, normal EKG.  Compared to 10/18/2023, no significant change. Confirmed by Juanda Luba, Jagadeesh ([id number]) on 10/19/2023 9:51:33 AM    Medications ordered    No orders of the defined types were placed in this encounter.    ASSESSMENT AND PLAN: .      ICD-10-CM   1. Morbid obesity with BMI of 40.0-44.9, adult (HCC)  E66.01    Z68.41     2. Musculoskeletal chest pain  R07.89 EKG 12-Lead     Assessment & Plan Musculoskeletal chest wall pain Persistent chest pain for two weeks, described as pressure and soreness, located in the chest and radiating to the shoulders and arms. Pain is musculoskeletal, and is easily reproducible, also patient had chest pain continuously for 4 to 5 days.  Prior to presenting to the ED and she had cardiac markers which were negative and EKG was normal.  Hence likelihood of ACS is very low.  Likely due to strain or injury to the cartilage between the ribs and breastbone. Normal EKG, enzymes, and chest x-ray. Low suspicion for cardiac issues due to normal cholesterol, kidney function, and absence of hypertension, diabetes, or smoking history. - Reassure that the pain is musculoskeletal and not cardiac-related. - Advise on weight loss to improve overall health. - She has no traditional risk factors including hypertension, hypercholesterolemia or diabetes or smoking.  Obesity is her only risk factor.  Obesity Obesity is a significant health concern and a contributing factor to her overall health status. No hypertension, hypercholesterolemia, or diabetes, but obesity is the primary factor affecting her health. - Encourage weight loss through diet and exercise. - Advise follow-up with primary care physician,  [facility], for ongoing management.   Signed,  Gordy Bergamo, MD, [facility] 10/19/2023, 5:48 PM [facility] [address] [place], [place] [address] Phone: [phone]. Fax:  [phone]

## 2023-10-19 NOTE — Patient Instructions (Signed)
